# Patient Record
Sex: Male | Born: 1975 | Race: White | Hispanic: No | Marital: Married | Smoking: Never smoker
Health system: Southern US, Community
[De-identification: ages and names within clinical notes are randomized; demographics above are authoritative.]

---

## 2003-11-23 HISTORY — PX: CYST EXCISION: SHX5701

## 2013-11-07 ENCOUNTER — Encounter: Payer: Self-pay | Admitting: Family Medicine

## 2013-11-07 ENCOUNTER — Ambulatory Visit (INDEPENDENT_AMBULATORY_CARE_PROVIDER_SITE_OTHER): Payer: BC Managed Care – PPO | Admitting: Family Medicine

## 2013-11-07 VITALS — BP 110/60 | HR 72 | Temp 98.2°F | Ht 72.0 in | Wt 208.5 lb

## 2013-11-07 DIAGNOSIS — Z0000 Encounter for general adult medical examination without abnormal findings: Secondary | ICD-10-CM | POA: Insufficient documentation

## 2013-11-07 NOTE — Progress Notes (Signed)
Subjective:    Patient ID: Adam Berger, male    DOB: 03-07-1976, 37 y.o.   MRN: [id number]  HPI CC: new pt to establish  No questions or concerns today, would like CPE.  Moved here 6 yrs ago from [place].  H/o L4/5 issue 2013 after a fall down stairs - had xrays and CT scan along with ESI.  Was on gabapentin which helped but currently off.  Discussed surgery, but sxs improved with conservative management and prayer - saw [facility] ortho MD.  Has strengthened core and works out regularly.  Seat belt use discussed. Sunscreen use discussed.  No suspicious spots on skin.  No sunburns in past year.  Preventative: No recent CPE Flu shot - declines Tetanus - unsure, about 10 yrs ago. Declines Tdap today.  Lives with wife and 4 children (9,6,4,2) Occupation: pastor Edu: BS Activity: works out regularly (5:30am every morning), some cardio Diet: good water, fruits/vegetables daily, fish  Medications and allergies reviewed and updated in chart.  Past histories reviewed and updated if relevant as below. There are no active problems to display for this patient.  History reviewed. No pertinent past medical history. Past Surgical History  Procedure Laterality Date  . Cyst excision  2005    nasal cavity   History  Substance Use Topics  . Smoking status: Never Smoker   . Smokeless tobacco: Never Used  . Alcohol Use: No   Family History  Problem Relation Age of Onset  . Diabetes Maternal Grandfather   . CAD Paternal Grandfather 32    MI  . Cancer Neg Hx   . Stroke Neg Hx   . Hypertension Neg Hx    Allergies  Allergen Reactions  . Monkey-Derived Products Anaphylaxis  . Penicillins Other (See Comments)    Unknown--Childhood   No current outpatient prescriptions on file prior to visit.   No current facility-administered medications on file prior to visit.     Review of Systems  Constitutional: Negative for fever, chills, activity change, appetite change, fatigue and  unexpected weight change.  HENT: Negative for hearing loss.   Eyes: Negative for visual disturbance.  Respiratory: Negative for cough, chest tightness, shortness of breath and wheezing.   Cardiovascular: Negative for chest pain, palpitations and leg swelling.  Gastrointestinal: Negative for nausea, vomiting, abdominal pain, diarrhea, constipation, blood in stool and abdominal distention.  Genitourinary: Negative for hematuria and difficulty urinating.  Musculoskeletal: Negative for arthralgias, myalgias and neck pain.  Skin: Negative for rash.  Neurological: Negative for dizziness, seizures, syncope and headaches.  Hematological: Negative for adenopathy. Does not bruise/bleed easily.  Psychiatric/Behavioral: Negative for dysphoric mood. The patient is not nervous/anxious.        Objective:   Physical Exam  Nursing note and vitals reviewed. Constitutional: He is oriented to person, place, and time. He appears well-developed and well-nourished. No distress.  HENT:  Head: Normocephalic and atraumatic.  Right Ear: Hearing, tympanic membrane, external ear and ear canal normal.  Left Ear: Hearing, tympanic membrane, external ear and ear canal normal.  Nose: Nose normal.  Mouth/Throat: Oropharynx is clear and moist. No oropharyngeal exudate.  Eyes: Conjunctivae and EOM are normal. Pupils are equal, round, and reactive to light. No scleral icterus.  Neck: Normal range of motion. Neck supple. No thyromegaly present.  Cardiovascular: Normal rate, regular rhythm, normal heart sounds and intact distal pulses.   No murmur heard. Pulses:      Radial pulses are 2+ on the right side, and 2+ on  the left side.  Pulmonary/Chest: Effort normal and breath sounds normal. No respiratory distress. He has no wheezes. He has no rales.  Abdominal: Soft. Bowel sounds are normal. He exhibits no distension and no mass. There is no tenderness. There is no rebound and no guarding.  Musculoskeletal: Normal range of  motion. He exhibits no edema.  Lymphadenopathy:    He has no cervical adenopathy.  Neurological: He is alert and oriented to person, place, and time.  CN grossly intact, station and gait intact  Skin: Skin is warm and dry. No rash noted.  No suspicious moles.    Psychiatric: He has a normal mood and affect. His behavior is normal. Judgment and thought content normal.       Assessment & Plan:

## 2013-11-07 NOTE — Assessment & Plan Note (Signed)
Preventative protocols reviewed and updated unless pt declined. Discussed healthy diet and lifestyle.  

## 2013-11-07 NOTE — Patient Instructions (Signed)
Good to meet you today! Call us with questions. Return at your convenience fasting for routine blood work. Return as needed or in 2-3 years for next physical.

## 2013-11-07 NOTE — Progress Notes (Signed)
Pre-visit discussion using our clinic review tool. No additional management support is needed unless otherwise documented below in the visit note.  

## 2013-11-12 ENCOUNTER — Other Ambulatory Visit: Payer: BC Managed Care – PPO

## 2014-03-08 ENCOUNTER — Ambulatory Visit (INDEPENDENT_AMBULATORY_CARE_PROVIDER_SITE_OTHER): Payer: BC Managed Care – PPO | Admitting: Family Medicine

## 2014-03-08 ENCOUNTER — Encounter: Payer: Self-pay | Admitting: Family Medicine

## 2014-03-08 VITALS — BP 112/70 | HR 60 | Temp 98.2°F | Wt 208.8 lb

## 2014-03-08 DIAGNOSIS — R1900 Intra-abdominal and pelvic swelling, mass and lump, unspecified site: Secondary | ICD-10-CM

## 2014-03-08 DIAGNOSIS — R1909 Other intra-abdominal and pelvic swelling, mass and lump: Secondary | ICD-10-CM

## 2014-03-08 DIAGNOSIS — R109 Unspecified abdominal pain: Secondary | ICD-10-CM

## 2014-03-08 DIAGNOSIS — R1030 Lower abdominal pain, unspecified: Secondary | ICD-10-CM

## 2014-03-08 NOTE — Progress Notes (Signed)
Pre visit review using our clinic review tool, if applicable. No additional management support is needed unless otherwise documented below in the visit note.  H/o back pain, episodically.  He had fallen a year ago, injured his elbow. Then had a L4L5 bulge, s/p injection x2.  [facility], started on gabapentin for sciatica pain. He was able to get back to exercising and get back to his baseline weight. Sciatic pain resolved, since last April.   Has been working out consistently.  A lot of upper body work.  While working out had R testicle pain.  Burning pain. He stopped working out, laid off for 1 week. Returned to working out 1 week later and then it returned. Better with stopping exercise, but had some residual pain after the second episode.  No abd pain but some occ medial R thigh pain.  No dysuria. No blood in urine.  No testicle pain now.  Usually happened with triceps extensions.    Meds, vitals, and allergies reviewed.   ROS: See HPI.  Otherwise, noncontributory.  nad ncat Mmm rrr ctab abd soft, not ttp likely R IH Testes bilaterally descended without nodularity, tenderness or masses. No scrotal masses or lesions. No penis lesions or urethral discharge.

## 2014-03-08 NOTE — Patient Instructions (Signed)
Adam Berger will call about your referral. Limit lifting as much as possible in the meantime.  Take ibuprofen in the meantime after eating if needed.

## 2014-03-10 DIAGNOSIS — R1909 Other intra-abdominal and pelvic swelling, mass and lump: Secondary | ICD-10-CM | POA: Insufficient documentation

## 2014-03-10 NOTE — Assessment & Plan Note (Signed)
Suggestive sx/hx and I told him I think I feel the mass on the R side.  Refer to gen surgery and limit lifting in meantime. D/w pt re: anatomy.  He agrees.

## 2014-03-11 ENCOUNTER — Telehealth: Payer: Self-pay | Admitting: *Deleted

## 2014-03-11 ENCOUNTER — Telehealth: Payer: Self-pay | Admitting: Family Medicine

## 2014-03-11 NOTE — Telephone Encounter (Signed)
Patient Information:  Caller Name: Sharia ReeveJosh  Phone: [phone]  Patient: Adam Berger, [name]  Gender: Male  DOB: 26-Jan-1976  Age: [age] Years  PCP: Crawford Givensuncan, Graham Clelia Croft(Shaw) [facility](Family Practice)  Office Follow Up:  Does the office need to follow up with this patient?: No  Instructions For The Office: N/A  RN Note:  Pt reports that [facility]  Office contacted per Profile for ED dispo and advised to go ahead and send pt to ED.  Pt agrees.  Symptoms  Reason For Call & Symptoms: Pt was seen and examined in office on 4/17 right testicle pain.  Pt reports that since exam the pain has been much worse.  Pt also reports that he has burning with unination.  Pt states that he was planned referral to gen sug but now is unsure if he can wait.  Pain is reported as moderate and he is currently unable to function. Nausea is present.  Last BM on 4/19.  Pt reports that he needs medical attention today and is unable to wait until Gen Sug can be scheduled.  Reviewed Health History In EMR: Yes  Reviewed Medications In EMR: Yes  Reviewed Allergies In EMR: Yes  Reviewed Surgeries / Procedures: Yes  Date of Onset of Symptoms: 03/08/2014  Guideline(s) Used:  Abdominal Pain - Male  Disposition Per Guideline:   Go to ED Now  Reason For Disposition Reached:   Severe abdominal pain (e.g., excruciating)  Advice Given:  N/A  Patient Will Follow Care Advice:  YES

## 2014-03-11 NOTE — Telephone Encounter (Signed)
Amy at [facility] says the patient says he has been in excruciating pain since the exam on Friday.  Amy ([facility]) feels that the patient should go to ER and is asking if you would agree.  I took the liberty to say that I felt you would agree.  [facility].  I asked Amy to please have him to request his records of the visit to be sent to our office.

## 2014-03-11 NOTE — Telephone Encounter (Signed)
Agree with ER.

## 2014-03-12 ENCOUNTER — Telehealth: Payer: Self-pay

## 2014-03-12 DIAGNOSIS — K4090 Unilateral inguinal hernia, without obstruction or gangrene, not specified as recurrent: Secondary | ICD-10-CM

## 2014-03-12 NOTE — Telephone Encounter (Signed)
Patient advised.

## 2014-03-12 NOTE — Telephone Encounter (Signed)
Ordered. Thanks

## 2014-03-12 NOTE — Telephone Encounter (Signed)
Mrs Neysa BonitoGresham said pt was seen [facility] 03/11/14; pt was dx with hernia but [facility] mid - end of May. Pt pain level in groin fluctuates between 2 - 8. Pt cannot wait til mid May for surgery. Mrs Neysa BonitoGresham request surgical referral to doctor in [place] or [place] ASAP. Mrs Neysa BonitoGresham request cb.

## 2014-03-25 ENCOUNTER — Ambulatory Visit (INDEPENDENT_AMBULATORY_CARE_PROVIDER_SITE_OTHER): Payer: BC Managed Care – PPO | Admitting: Surgery

## 2014-03-28 ENCOUNTER — Encounter: Payer: Self-pay | Admitting: Family Medicine

## 2014-04-22 HISTORY — PX: VASECTOMY: SHX75

## 2014-04-22 HISTORY — PX: INGUINAL HERNIA REPAIR: SUR1180

## 2014-04-25 ENCOUNTER — Encounter: Payer: Self-pay | Admitting: Family Medicine

## 2014-05-04 ENCOUNTER — Encounter: Payer: Self-pay | Admitting: Family Medicine

## 2016-11-22 HISTORY — PX: CARDIOVASCULAR STRESS TEST: SHX262

## 2016-11-25 ENCOUNTER — Ambulatory Visit (INDEPENDENT_AMBULATORY_CARE_PROVIDER_SITE_OTHER): Payer: BLUE CROSS/BLUE SHIELD | Admitting: Family Medicine

## 2016-11-25 ENCOUNTER — Encounter: Payer: Self-pay | Admitting: Family Medicine

## 2016-11-25 VITALS — BP 124/80 | HR 76 | Temp 98.9°F | Ht 72.0 in | Wt 208.8 lb

## 2016-11-25 DIAGNOSIS — B351 Tinea unguium: Secondary | ICD-10-CM

## 2016-11-25 DIAGNOSIS — Z131 Encounter for screening for diabetes mellitus: Secondary | ICD-10-CM

## 2016-11-25 DIAGNOSIS — Z13220 Encounter for screening for lipoid disorders: Secondary | ICD-10-CM | POA: Diagnosis not present

## 2016-11-25 DIAGNOSIS — Z23 Encounter for immunization: Secondary | ICD-10-CM

## 2016-11-25 DIAGNOSIS — Z0000 Encounter for general adult medical examination without abnormal findings: Secondary | ICD-10-CM

## 2016-11-25 MED ORDER — TERBINAFINE HCL 250 MG PO TABS: 250.0000 mg | ORAL_TABLET | Freq: Every day | ORAL | 0 refills | Status: DC

## 2016-11-25 NOTE — Addendum Note (Signed)
Addended by: Josph MachoANCE, KIMBERLY A on: 11/25/2016 12:29 PM   Modules accepted: Orders

## 2016-11-25 NOTE — Patient Instructions (Addendum)
Tdap today. Return at your convenience fasting for blood work.  Take terbinafine 250mg  daily for 6 weeks. Let me know if ongoing trouble after this.  Lotrimin between toes. Return as needed or in 2-3 years for next physical.   Health Maintenance, Male A healthy lifestyle and preventative care can promote health and wellness.  Maintain regular health, dental, and eye exams.  Eat a healthy diet. Foods like vegetables, fruits, whole grains, low-fat dairy products, and lean protein foods contain the nutrients you need and are low in calories. Decrease your intake of foods high in solid fats, added sugars, and salt. Get information about a proper diet from your health care provider, if necessary.  Regular physical exercise is one of the most important things you can do for your health. Most adults should get at least 150 minutes of moderate-intensity exercise (any activity that increases your heart rate and causes you to sweat) each week. In addition, most adults need muscle-strengthening exercises on 2 or more days a week.   Maintain a healthy weight. The body mass index (BMI) is a screening tool to identify possible weight problems. It provides an estimate of body fat based on height and weight. Your health care provider can find your BMI and can help you achieve or maintain a healthy weight. For males 20 years and older:  A BMI below 18.5 is considered underweight.  A BMI of 18.5 to 24.9 is normal.  A BMI of 25 to 29.9 is considered overweight.  A BMI of 30 and above is considered obese.  Maintain normal blood lipids and cholesterol by exercising and minimizing your intake of saturated fat. Eat a balanced diet with plenty of fruits and vegetables. Blood tests for lipids and cholesterol should begin at age 39 and be repeated every 5 years. If your lipid or cholesterol levels are high, you are over age 39, or you are at high risk for heart disease, you may need your cholesterol levels checked  more frequently.Ongoing high lipid and cholesterol levels should be treated with medicines if diet and exercise are not working.  If you smoke, find out from your health care provider how to quit. If you do not use tobacco, do not start.  Lung cancer screening is recommended for adults aged 55-80 years who are at high risk for developing lung cancer because of a history of smoking. A yearly low-dose CT scan of the lungs is recommended for people who have at least a 30-pack-year history of smoking and are current smokers or have quit within the past 15 years. A pack year of smoking is smoking an average of 1 pack of cigarettes a day for 1 year (for example, a 30-pack-year history of smoking could mean smoking 1 pack a day for 30 years or 2 packs a day for 15 years). Yearly screening should continue until the smoker has stopped smoking for at least 15 years. Yearly screening should be stopped for people who develop a health problem that would prevent them from having lung cancer treatment.  If you choose to drink alcohol, do not have more than 2 drinks per day. One drink is considered to be 12 oz (360 mL) of beer, 5 oz (150 mL) of wine, or 1.5 oz (45 mL) of liquor.  Avoid the use of street drugs. Do not share needles with anyone. Ask for help if you need support or instructions about stopping the use of drugs.  High blood pressure causes heart disease and increases the  risk of stroke. High blood pressure is more likely to develop in:  People who have blood pressure in the end of the normal range (100-139/85-89 mm Hg).  People who are overweight or obese.  People who are African American.  If you are 80-64 years of age, have your blood pressure checked every 3-5 years. If you are 30 years of age or older, have your blood pressure checked every year. You should have your blood pressure measured twice-once when you are at a hospital or clinic, and once when you are not at a hospital or clinic. Record  the average of the two measurements. To check your blood pressure when you are not at a hospital or clinic, you can use:  An automated blood pressure machine at a pharmacy.  A home blood pressure monitor.  If you are 58-25 years old, ask your health care provider if you should take aspirin to prevent heart disease.  Diabetes screening involves taking a blood sample to check your fasting blood sugar level. This should be done once every 3 years after age 42 if you are at a normal weight and without risk factors for diabetes. Testing should be considered at a younger age or be carried out more frequently if you are overweight and have at least 1 risk factor for diabetes.  Colorectal cancer can be detected and often prevented. Most routine colorectal cancer screening begins at the age of 23 and continues through age 44. However, your health care provider may recommend screening at an earlier age if you have risk factors for colon cancer. On a yearly basis, your health care provider may provide home test kits to check for hidden blood in the stool. A small camera at the end of a tube may be used to directly examine the colon (sigmoidoscopy or colonoscopy) to detect the earliest forms of colorectal cancer. Talk to your health care provider about this at age 68 when routine screening begins. A direct exam of the colon should be repeated every 5-10 years through age 37, unless early forms of precancerous polyps or small growths are found.  People who are at an increased risk for hepatitis B should be screened for this virus. You are considered at high risk for hepatitis B if:  You were born in a country where hepatitis B occurs often. Talk with your health care provider about which countries are considered high risk.  Your parents were born in a high-risk country and you have not received a shot to protect against hepatitis B (hepatitis B vaccine).  You have HIV or AIDS.  You use needles to inject  street drugs.  You live with, or have sex with, someone who has hepatitis B.  You are a man who has sex with other men (MSM).  You get hemodialysis treatment.  You take certain medicines for conditions like cancer, organ transplantation, and autoimmune conditions.  Hepatitis C blood testing is recommended for all people born from [birth year] through 1963 and any individual with known risk factors for hepatitis C.  Healthy men should no longer receive prostate-specific antigen (PSA) blood tests as part of routine cancer screening. Talk to your health care provider about prostate cancer screening.  Testicular cancer screening is not recommended for adolescents or adult males who have no symptoms. Screening includes self-exam, a health care provider exam, and other screening tests. Consult with your health care provider about any symptoms you have or any concerns you have about testicular cancer.  Practice safe  sex. Use condoms and avoid high-risk sexual practices to reduce the spread of sexually transmitted infections (STIs).  You should be screened for STIs, including gonorrhea and chlamydia if:  You are sexually active and are younger than 24 years.  You are older than 24 years, and your health care provider tells you that you are at risk for this type of infection.  Your sexual activity has changed since you were last screened, and you are at an increased risk for chlamydia or gonorrhea. Ask your health care provider if you are at risk.  If you are at risk of being infected with HIV, it is recommended that you take a prescription medicine daily to prevent HIV infection. This is called pre-exposure prophylaxis (PrEP). You are considered at risk if:  You are a man who has sex with other men (MSM).  You are a heterosexual man who is sexually active with multiple partners.  You take drugs by injection.  You are sexually active with a partner who has HIV.  Talk with your health care provider  about whether you are at high risk of being infected with HIV. If you choose to begin PrEP, you should first be tested for HIV. You should then be tested every 3 months for as long as you are taking PrEP.  Use sunscreen. Apply sunscreen liberally and repeatedly throughout the day. You should seek shade when your shadow is shorter than you. Protect yourself by wearing long sleeves, pants, a wide-brimmed hat, and sunglasses year round whenever you are outdoors.  Tell your health care provider of new moles or changes in moles, especially if there is a change in shape or color. Also, tell your health care provider if a mole is larger than the size of a pencil eraser.  A one-time screening for abdominal aortic aneurysm (AAA) and surgical repair of large AAAs by ultrasound is recommended for men aged 65-75 years who are current or former smokers.  Stay current with your vaccines (immunizations). This information is not intended to replace advice given to you by your health care provider. Make sure you discuss any questions you have with your health care provider. Document Released: 05/06/2008 Document Revised: 11/29/2014 Document Reviewed: 08/12/2015 Elsevier Interactive Patient Education  2017 [employer].

## 2016-11-25 NOTE — Assessment & Plan Note (Signed)
Preventative protocols reviewed and updated unless pt declined. Discussed healthy diet and lifestyle.  

## 2016-11-25 NOTE — Progress Notes (Signed)
BP 124/80   Pulse 76   Temp 98.9 F (37.2 C) (Oral)   Ht 6' (1.829 m)   Wt 208 lb 12 oz (94.7 kg)   BMI 28.31 kg/m    CC: CPE Subjective:    Patient ID: Adam Berger, male    DOB: [birth year], 41 y.o.   MRN: [id number]  HPI: Adam Berger is a 41 y.o. male presenting on 11/25/2016 for Annual Exam   Onychomycosis of great toes - worse after hiking [place] (yearly occurrence).   Preventative: Flu shot - declines Tdap today Seat belt use discussed Sunscreen use discussed. 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 Interim medical history since our last visit reviewed. Allergies and medications reviewed and updated. No current outpatient prescriptions on file prior to visit.   No current facility-administered medications on file prior to visit.     Review of Systems  Constitutional: Negative for activity change, appetite change, chills, fatigue, fever and unexpected weight change.  HENT: Negative for hearing loss.   Eyes: Negative for visual disturbance.  Respiratory: Negative for cough, chest tightness, shortness of breath and wheezing.   Cardiovascular: Negative for chest pain, palpitations and leg swelling.  Gastrointestinal: Negative for abdominal distention, abdominal pain, blood in stool, constipation, diarrhea, nausea and vomiting.  Genitourinary: Negative for difficulty urinating and hematuria.  Musculoskeletal: Negative for arthralgias, myalgias and neck pain.  Skin: Negative for rash.  Neurological: Negative for dizziness, seizures, syncope and headaches.  Hematological: Negative for adenopathy. Does not bruise/bleed easily.  Psychiatric/Behavioral: Negative for dysphoric mood.  The patient is not nervous/anxious.    Per HPI unless specifically indicated in ROS section     Objective:    BP 124/80   Pulse 76   Temp 98.9 F (37.2 C) (Oral)   Ht 6' (1.829 m)   Wt 208 lb 12 oz (94.7 kg)   BMI 28.31 kg/m   Wt Readings from Last 3 Encounters:  11/25/16 208 lb 12 oz (94.7 kg)  03/08/14 208 lb 12 oz (94.7 kg)  11/07/13 208 lb 8 oz (94.6 kg)    Physical Exam  Constitutional: He is oriented to person, place, and time. He appears well-developed and well-nourished. No distress.  HENT:  Head: Normocephalic and atraumatic.  Right Ear: Hearing, tympanic membrane, external ear and ear canal normal.  Left Ear: Hearing, tympanic membrane, external ear and ear canal normal.  Nose: Nose normal.  Mouth/Throat: Uvula is midline, oropharynx is clear and moist and mucous membranes are normal. No oropharyngeal exudate, posterior oropharyngeal edema or posterior oropharyngeal erythema.  Eyes: Conjunctivae and EOM are normal. Pupils are equal, round, and reactive to light. No scleral icterus.  Neck: Normal range of motion. Neck supple. No thyromegaly present.  Cardiovascular: Normal rate, regular rhythm, normal heart sounds and intact distal pulses.   No murmur heard. Pulses:      Radial pulses are 2+ on the right side, and 2+ on the left side.  Pulmonary/Chest: Effort normal and breath sounds normal. No respiratory distress. He has no wheezes. He has no rales.  Abdominal: Soft. Bowel sounds are normal. He exhibits no distension and no mass. There is no tenderness. There is no rebound and no guarding.  Musculoskeletal: Normal range of motion. He  exhibits no edema.  Lymphadenopathy:    He has no cervical adenopathy.  Neurological: He is alert and oriented to person, place, and time.  CN grossly intact, station and gait intact  Skin: Skin is warm and dry. No rash noted.  Onychomycosis of both great toes Skin maceration between 4th/5th toes bilaterally  Psychiatric: He has a  normal mood and affect. His behavior is normal. Judgment and thought content normal.  Nursing note and vitals reviewed.  No results found for this or any previous visit.    Assessment & Plan:   Problem List Items Addressed This Visit    Healthcare maintenance - Primary    Preventative protocols reviewed and updated unless pt declined. Discussed healthy diet and lifestyle.       Onychomycosis    Treat with terbinafine 6 wk course. Update with effect.       Relevant Medications   terbinafine (LAMISIL) 250 MG tablet   Other Relevant Orders   Comprehensive metabolic panel    Other Visit Diagnoses    Lipid screening       Relevant Orders   Lipid panel   Diabetes mellitus screening       Relevant Orders   Comprehensive metabolic panel       Follow up plan: Return in about 2 years (around 11/25/2018) for annual exam, prior fasting for blood work.  Eustaquio Boyden, MD

## 2016-11-25 NOTE — Assessment & Plan Note (Signed)
Treat with terbinafine 6 wk course. Update with effect.

## 2016-11-25 NOTE — Progress Notes (Signed)
Pre visit review using our clinic review tool, if applicable. No additional management support is needed unless otherwise documented below in the visit note. 

## 2016-11-26 ENCOUNTER — Other Ambulatory Visit (INDEPENDENT_AMBULATORY_CARE_PROVIDER_SITE_OTHER): Payer: BLUE CROSS/BLUE SHIELD

## 2016-11-26 DIAGNOSIS — Z13220 Encounter for screening for lipoid disorders: Secondary | ICD-10-CM

## 2016-11-26 DIAGNOSIS — Z131 Encounter for screening for diabetes mellitus: Secondary | ICD-10-CM

## 2016-11-26 DIAGNOSIS — B351 Tinea unguium: Secondary | ICD-10-CM

## 2016-11-26 LAB — COMPREHENSIVE METABOLIC PANEL
ALBUMIN: 4.5 g/dL (ref 3.5–5.2)
ALT: 16 U/L (ref 0–53)
AST: 17 U/L (ref 0–37)
Alkaline Phosphatase: 53 U/L (ref 39–117)
BUN: 23 mg/dL (ref 6–23)
CHLORIDE: 106 meq/L (ref 96–112)
CO2: 31 mEq/L (ref 19–32)
Calcium: 9.4 mg/dL (ref 8.4–10.5)
Creatinine, Ser: 1.29 mg/dL (ref 0.40–1.50)
GFR: 66.03 mL/min (ref >60.00)
Glucose, Bld: 97 mg/dL (ref 70–99)
POTASSIUM: 4.8 meq/L (ref 3.5–5.1)
SODIUM: 140 meq/L (ref 135–145)
Total Bilirubin: 0.9 mg/dL (ref 0.2–1.2)
Total Protein: 7.3 g/dL (ref 6.0–8.3)

## 2016-11-26 LAB — LIPID PANEL
CHOLESTEROL: 206 mg/dL — AB (ref 0–200)
HDL: 43.8 mg/dL (ref >39.00)
LDL CALC: 149 mg/dL — AB (ref 0–99)
NonHDL: 162.17
TRIGLYCERIDES: 68 mg/dL (ref 0.0–149.0)
Total CHOL/HDL Ratio: 5
VLDL: 13.6 mg/dL (ref 0.0–40.0)

## 2017-01-11 ENCOUNTER — Telehealth: Payer: Self-pay

## 2017-01-11 DIAGNOSIS — Z5181 Encounter for therapeutic drug level monitoring: Secondary | ICD-10-CM

## 2017-01-11 NOTE — Telephone Encounter (Signed)
Pt was seen 11/25/16 and pt has been taking terbinafine 250 mg and will finish rx on 01/12/17. Pt said toenail not grown out yet but now still looks like fungus is there. Pt wants to know next step. [facility] [address]. Pt request cb.

## 2017-01-11 NOTE — Telephone Encounter (Signed)
Come in for lab visit only - repeat liver function. If normal, we can do another 6 wk course terbinafine.

## 2017-01-12 NOTE — Telephone Encounter (Signed)
Message left on voice mail for patient to call back to schedule lab appointment.

## 2017-01-13 ENCOUNTER — Other Ambulatory Visit (INDEPENDENT_AMBULATORY_CARE_PROVIDER_SITE_OTHER): Payer: BLUE CROSS/BLUE SHIELD

## 2017-01-13 ENCOUNTER — Other Ambulatory Visit: Payer: Self-pay | Admitting: Family Medicine

## 2017-01-13 DIAGNOSIS — Z5181 Encounter for therapeutic drug level monitoring: Secondary | ICD-10-CM

## 2017-01-13 LAB — HEPATIC FUNCTION PANEL
ALK PHOS: 53 U/L (ref 39–117)
ALT: 25 U/L (ref 0–53)
AST: 21 U/L (ref 0–37)
Albumin: 4.4 g/dL (ref 3.5–5.2)
BILIRUBIN TOTAL: 0.4 mg/dL (ref 0.2–1.2)
Bilirubin, Direct: 0.1 mg/dL (ref 0.0–0.3)
Total Protein: 7.1 g/dL (ref 6.0–8.3)

## 2017-01-13 MED ORDER — TERBINAFINE HCL 250 MG PO TABS: 250.0000 mg | ORAL_TABLET | Freq: Every day | ORAL | 0 refills | Status: DC

## 2017-08-28 ENCOUNTER — Ambulatory Visit
Admission: EM | Admit: 2017-08-28 | Discharge: 2017-08-28 | Disposition: A | Discharge status: Home / Self Care | Payer: BLUE CROSS/BLUE SHIELD | Attending: Family Medicine | Admitting: Family Medicine

## 2017-08-28 ENCOUNTER — Ambulatory Visit (INDEPENDENT_AMBULATORY_CARE_PROVIDER_SITE_OTHER): Payer: BLUE CROSS/BLUE SHIELD

## 2017-08-28 ENCOUNTER — Other Ambulatory Visit: Payer: Self-pay

## 2017-08-28 DIAGNOSIS — R0789 Other chest pain: Secondary | ICD-10-CM | POA: Insufficient documentation

## 2017-08-28 DIAGNOSIS — R0602 Shortness of breath: Secondary | ICD-10-CM | POA: Diagnosis not present

## 2017-08-28 DIAGNOSIS — Z8249 Family history of ischemic heart disease and other diseases of the circulatory system: Secondary | ICD-10-CM | POA: Diagnosis not present

## 2017-08-28 DIAGNOSIS — Z880 Allergy status to penicillin: Secondary | ICD-10-CM | POA: Diagnosis not present

## 2017-08-28 DIAGNOSIS — R079 Chest pain, unspecified: Secondary | ICD-10-CM

## 2017-08-28 NOTE — ED Triage Notes (Signed)
Pt reports intermittent left sided chest pain x past few weeks. Has felt like he can't get a good breath on the left lung. Had pain from about 8:30 until about 11:45. Ibuprofen helps the pain. No pain in triage. Still feels like he isn't getting a full breath in triage. NAD, Not appearing SOB.

## 2017-08-28 NOTE — ED Provider Notes (Signed)
[facility]    CSN: [id number] Arrival date & time: 08/28/17  1454     History   Chief Complaint Chief Complaint  Patient presents with  . Chest Pain    HPI Tresten Pantoja is a 41 y.o. male.   41 yo male presents with a c/o intermittent left sided chest pains and shortness of breath for the past 2 months. States had an episode earlier today with pain at it's worst (about a 5-6/10). He took some ibuprofen and the pain has subsided and almost completely resolved. States when he takes a deep breath, it feels like he's not filling his lungs completely. His prior episodes have occurred intermittently mainly at rest. States he exercises regularly jogging several miles a couple of times a week and denies having chest pain while exercising. Denies anxiety, fevers, chills, cough, recent prolong immobilization, calf pain, recent surgery, smoking, medications. States he's generally healthy. No cardiac risk factors.    The history is provided by the patient.  Chest Pain    History reviewed. No pertinent past medical history.  Patient Active Problem List   Diagnosis Date Noted  . Onychomycosis 11/25/2016  . Healthcare maintenance 11/07/2013    Past Surgical History:  Procedure Laterality Date  . CYST EXCISION  2005   nasal cavity  . INGUINAL HERNIA REPAIR Bilateral 04/2014   with mesh [facility])  . VASECTOMY  04/2014   [facility])       Home Medications    Prior to Admission medications   Medication Sig Start Date End Date Taking? Authorizing Provider  terbinafine (LAMISIL) 250 MG tablet Take 1 tablet (250 mg total) by mouth daily. 01/13/17   Eustaquio Boyden, MD    Family History Family History  Problem Relation Age of Onset  . Diabetes Maternal Grandfather   . CAD Paternal Grandfather 62       MI (smoker)  . Cancer Father        skin cancer on nose  . Stroke Neg Hx   . Hypertension Neg Hx     Social History Social History  Substance Use Topics  . Smoking  status: Never Smoker  . Smokeless tobacco: Never Used  . Alcohol use No     Allergies   Monkey-derived products and Penicillins   Review of Systems Review of Systems  Cardiovascular: Positive for chest pain.     Physical Exam Triage Vital Signs ED Triage Vitals  Enc Vitals Group     BP 08/28/17 1506 125/66     Pulse Rate 08/28/17 1506 66     Resp 08/28/17 1506 18     Temp 08/28/17 1506 (!) 97.5 F (36.4 C)     Temp Source 08/28/17 1506 Oral     SpO2 08/28/17 1506 96 %     Weight 08/28/17 1504 205 lb (93 kg)     Height 08/28/17 1504 6' (1.829 m)     Head Circumference --      Peak Flow --      Pain Score 08/28/17 1510 0     Pain Loc --      Pain Edu? --      Excl. in GC? --    No data found.   Updated Vital Signs BP 125/66 (BP Location: Right Arm)   Pulse 66   Temp (!) 97.5 F (36.4 C) (Oral)   Resp 18   Ht 6' (1.829 m)   Wt 205 lb (93 kg)   SpO2 96%   BMI  27.80 kg/m   Visual Acuity Right Eye Distance:   Left Eye Distance:   Bilateral Distance:    Right Eye Near:   Left Eye Near:    Bilateral Near:     Physical Exam  Constitutional: He appears well-developed and well-nourished. No distress.  HENT:  Head: Normocephalic and atraumatic.  Nose: Nose normal.  Neck: Normal range of motion. Neck supple. No tracheal deviation present. No thyromegaly present.  Cardiovascular: Normal rate, regular rhythm, normal heart sounds and intact distal pulses.   No murmur heard. Pulmonary/Chest: Effort normal and breath sounds normal. No stridor. No respiratory distress. He has no wheezes. He has no rales. He exhibits no tenderness.  Abdominal: Soft. He exhibits no distension. There is no tenderness.  Musculoskeletal: He exhibits no edema or tenderness.  Lymphadenopathy:    He has no cervical adenopathy.  Neurological: He is alert.  Skin: Skin is warm and dry. No rash noted. He is not diaphoretic.  Nursing note and vitals reviewed.    UC Treatments / Results    Labs (all labs ordered are listed, but only abnormal results are displayed) Labs Reviewed - No data to display  EKG  EKG Interpretation None       Radiology Dg Chest 2 View  Result Date: 08/28/2017 CLINICAL DATA:  PT with a heavy feeling on his left chest x today. Pt denies hx of injury, trauma, or surgery. EXAM: CHEST  2 VIEW COMPARISON:  None. FINDINGS: The heart size and mediastinal contours are within normal limits. Both lungs are clear. No pleural effusion or pneumothorax. The visualized skeletal structures are unremarkable. IMPRESSION: Normal chest radiographs. Electronically Signed   By: Amie Portland M.D.   On: 08/28/2017 15:23    Procedures ED EKG Date/Time: 08/28/2017 4:20 PM Performed by: Payton Mccallum Authorized by: Payton Mccallum   ECG reviewed by ED Physician in the absence of a cardiologist: yes   Previous ECG:    Previous ECG:  Unavailable Interpretation:    Interpretation: normal   Rate:    ECG rate:  75   ECG rate assessment: normal   Rhythm:    Rhythm: sinus rhythm   Ectopy:    Ectopy: none   QRS:    QRS axis:  Normal Conduction:    Conduction: normal   ST segments:    ST segments:  Normal T waves:    T waves: normal      (including critical care time)  Medications Ordered in UC Medications - No data to display   Initial Impression / Assessment and Plan / UC Course  I have reviewed the triage vital signs and the nursing notes.  Pertinent labs & imaging results that were available during my care of the patient were reviewed by me and considered in my medical decision making (see chart for details).      Final Clinical Impressions(s) / UC Diagnoses   Final diagnoses:  Nonspecific chest pain  (chronic, intermittent)   New Prescriptions Discharge Medication List as of 08/28/2017  4:11 PM     1. ekg/x-ray results (normal/negative) and possible/differential diagnoses reviewed with patient and wife 2. Discussed further work up here  with D-dimer test (explained test), however patient refuses blood test; explained also further evaluation with cardiac enzymes in the Emergency Department; patient states will follow up with his PCP tomorrow 3.  Follow up with PCP tomorrow for possible referral for further testing/specialist evaluation  4. Preventative ASA  po qd 5. Go to ED if symptoms  worsen/recur  Controlled Substance Prescriptions [place] Controlled Substance Registry consulted? Not Applicable   Payton Mccallum, MD 08/28/17 1640

## 2017-08-30 ENCOUNTER — Encounter: Payer: Self-pay | Admitting: Family Medicine

## 2017-08-30 ENCOUNTER — Ambulatory Visit (INDEPENDENT_AMBULATORY_CARE_PROVIDER_SITE_OTHER): Payer: BLUE CROSS/BLUE SHIELD | Admitting: Family Medicine

## 2017-08-30 VITALS — BP 118/68 | HR 63 | Temp 98.6°F | Wt 205.5 lb

## 2017-08-30 DIAGNOSIS — R079 Chest pain, unspecified: Secondary | ICD-10-CM

## 2017-08-30 MED ORDER — ASPIRIN EC 81 MG PO TBEC: 81.0000 mg | DELAYED_RELEASE_TABLET | Freq: Every day | ORAL | Status: DC

## 2017-08-30 NOTE — Assessment & Plan Note (Addendum)
Non exertional substernal chest pressure relieved with rest and ibuprofen. Overall atypical chest pain but given fmhx will need to r/o cardiac cause.  Check labs today (TSH, TnI, BMP, dLDL).  rec start aspirin  daily, avoid aerobic exercise for now, will refer to cardiology to discuss possible exercise treadmill stress test. Pt agrees with plan.

## 2017-08-30 NOTE — Progress Notes (Signed)
BP 118/68 (BP Location: Left Arm, Patient Position: Sitting, Cuff Size: Normal)   Pulse 63   Temp 98.6 F (37 C) (Oral)   Wt 205 lb 8 oz (93.2 kg)   SpO2 96%   BMI 27.87 kg/m    CC: f/u from [facility]  Subjective:    Patient ID: Adam Berger, male    DOB: 20-Jul-1976, 41 y.o.   MRN: [id number]  HPI: Adam Berger is a 41 y.o. male presenting on 08/30/2017 for Emergency Dept follow-up (Visited urgent care in [facility] 08/28/17 due to chest pain)   Seen Sunday at [facility] with 2 mo h/o intermittent left sided chest pain and difficulty taking deep breaths. Improved with ibuprofen . EKG and Xray were reassuring (reviewed).   Describes 2 month h/o intermittent substernal and left sided chest pressure. Improves with time, not reproducible. This does not feel like indigestion. Denies heartburn symptoms. No unilateral leg swelling. No coughing, no fever, dizziness, palpitations. Left jaw pain over the past month - saw dentist with reassuring dental evaluation.   Had similar pressure pain at rest while on vacation in [place] 2 wks ago, improved over time.  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  Normally runs for exercise - no pain with exertion, has not run in last few weeks.   Non smoker, no alcohol.   Fmhx CAD - paternal grandfather age 64 from massive MI (overweight, smoker).  Currently no further chest pain.  Relevant past medical, surgical, family and social history reviewed and updated as indicated. Interim medical history since our last visit reviewed. Allergies and medications reviewed and updated. Outpatient Medications Prior to Visit  Medication Sig Dispense Refill  . terbinafine (LAMISIL) 250 MG tablet Take 1 tablet (250 mg total) by mouth daily. 45 tablet 0   No facility-administered medications prior to visit.      Per HPI unless specifically indicated in ROS section  below Review of Systems     Objective:    BP 118/68 (BP Location: Left Arm, Patient Position: Sitting, Cuff Size: Normal)   Pulse 63   Temp 98.6 F (37 C) (Oral)   Wt 205 lb 8 oz (93.2 kg)   SpO2 96%   BMI 27.87 kg/m   Wt Readings from Last 3 Encounters:  08/30/17 205 lb 8 oz (93.2 kg)  08/28/17 205 lb (93 kg)  11/25/16 208 lb 12 oz (94.7 kg)    Physical Exam  Constitutional: He appears well-developed and well-nourished. No distress.  HENT:  Mouth/Throat: Oropharynx is clear and moist. No oropharyngeal exudate.  Eyes: Pupils are equal, round, and reactive to light. Conjunctivae are normal.  Neck: Normal range of motion. Neck supple. No thyromegaly present.  Cardiovascular: Normal rate, regular rhythm, normal heart sounds and intact distal pulses.   No murmur heard. Pulmonary/Chest: Effort normal and breath sounds normal. No respiratory distress. He has no wheezes. He has no rales. He exhibits tenderness (Some discomfort at xyphoid but not reproducible of prior symptoms).  Lungs clear  Musculoskeletal: He exhibits no edema.  Lymphadenopathy:    He has no cervical adenopathy.  Psychiatric: He has a normal mood and affect.  Nursing note and vitals reviewed.   DG Chest 2 View CLINICAL DATA:  PT with a heavy feeling on his left chest x today. Pt denies hx of injury, trauma, or surgery.  EXAM: CHEST  2 VIEW  COMPARISON:  None.  FINDINGS:  The heart size and mediastinal contours are within normal limits. Both lungs are clear. No pleural effusion or pneumothorax. The visualized skeletal structures are unremarkable.  IMPRESSION: Normal chest radiographs.  Electronically Signed   By: Amie Portland M.D.   On: 08/28/2017 15:23      Assessment & Plan:   Problem List Items Addressed This Visit    Chest pain - Primary    Non exertional substernal chest pressure relieved with rest and ibuprofen. Overall atypical chest pain but given fmhx will need to r/o cardiac cause.   Check labs today (TSH, TnI, BMP, dLDL).  rec start aspirin  daily, avoid aerobic exercise for now, will refer to cardiology to discuss possible exercise treadmill stress test. Pt agrees with plan.       Relevant Orders   TSH   Basic metabolic panel   LDL Cholesterol, Direct   Ambulatory referral to Cardiology   Troponin I       Follow up plan: No Follow-up on file.  Eustaquio Boyden, MD

## 2017-08-30 NOTE — Patient Instructions (Addendum)
Start aspirin  daily Labs today  I do want to rule out heart as cause of symptoms.  We will refer you to cardiologist - for further evaluation.

## 2017-08-31 LAB — BASIC METABOLIC PANEL
BUN: 22 mg/dL (ref 6–23)
CALCIUM: 9.8 mg/dL (ref 8.4–10.5)
CO2: 30 meq/L (ref 19–32)
CREATININE: 1.31 mg/dL (ref 0.40–1.50)
Chloride: 103 mEq/L (ref 96–112)
GFR: 64.61 mL/min (ref >60.00)
Glucose, Bld: 83 mg/dL (ref 70–99)
Potassium: 4.7 mEq/L (ref 3.5–5.1)
SODIUM: 139 meq/L (ref 135–145)

## 2017-08-31 LAB — LDL CHOLESTEROL, DIRECT: Direct LDL: 129 mg/dL

## 2017-08-31 LAB — TROPONIN I: Troponin I: <0.01 ng/mL (ref <=0.0)

## 2017-08-31 LAB — TSH: TSH: 1.79 u[IU]/mL (ref 0.35–4.50)

## 2017-09-01 ENCOUNTER — Ambulatory Visit (INDEPENDENT_AMBULATORY_CARE_PROVIDER_SITE_OTHER): Payer: BLUE CROSS/BLUE SHIELD | Admitting: Cardiovascular Disease

## 2017-09-01 ENCOUNTER — Encounter: Payer: Self-pay | Admitting: Cardiovascular Disease

## 2017-09-01 VITALS — BP 110/76 | HR 61 | Ht 72.0 in | Wt 206.8 lb

## 2017-09-01 DIAGNOSIS — R079 Chest pain, unspecified: Secondary | ICD-10-CM

## 2017-09-01 NOTE — Patient Instructions (Addendum)
Medication Instructions:  Your physician recommends that you continue on your current medications as directed. Please refer to the Current Medication list given to you today.   Labwork: none  Testing/Procedures: Your physician has requested that you have an exercise tolerance test. For further information please visit [email or web address] Please also follow instruction sheet, as given.  No caffeine (includes chocolate and decaffeinated beverages) or smoking 24 hours before the test.  Please wear comfortable walking shoes (ie., sneakers). No sandals, flip flop or smooth bottom shoes.    Follow-Up: Your physician recommends that you schedule a follow-up appointment as needed.    Any Other Special Instructions Will Be Listed Below (If Applicable).     If you need a refill on your cardiac medications before your next appointment, please call your pharmacy.   Exercise Stress Electrocardiogram An exercise stress electrocardiogram is a test that is done to evaluate the blood supply to your heart. This test may also be called exercise stress electrocardiography. The test is done while you are walking on a treadmill. The goal of this test is to raise your heart rate. This test is done to find areas of poor blood flow to the heart by determining the extent of coronary artery disease (CAD). CAD is defined as narrowing in one or more heart (coronary) arteries of more than 70%. If you have an abnormal test result, this may mean that you are not getting adequate blood flow to your heart during exercise. Additional testing may be needed to understand why your test was abnormal. Tell a health care provider about:  Any allergies you have.  All medicines you are taking, including vitamins, herbs, eye drops, creams, and over-the-counter medicines.  Any problems you or family members have had with anesthetic medicines.  Any blood disorders you have.  Any surgeries you have had.  Any medical  conditions you have.  Possibility of pregnancy, if this applies. What are the risks? Generally, this is a safe procedure. However, as with any procedure, complications can occur. Possible complications can include:  Pain or pressure in the following areas: ? Chest. ? Jaw or neck. ? Between your shoulder blades. ? Radiating down your left arm.  Dizziness or light-headedness.  Shortness of breath.  Increased or irregular heartbeats.  Nausea or vomiting.  Heart attack (rare).  What happens before the procedure?  Avoid all forms of caffeine 24 hours before your test or as directed by your health care provider. This includes coffee, tea (even decaffeinated tea), caffeinated sodas, chocolate, cocoa, and certain pain medicines.  Follow your health care provider's instructions regarding eating and drinking before the test.  Take your medicines as directed at regular times with water unless instructed otherwise. Exceptions may include: ? If you have diabetes, ask how you are to take your insulin or pills. It is common to adjust insulin dosing the morning of the test. ? If you are taking beta-blocker medicines, it is important to talk to your health care provider about these medicines well before the date of your test. Taking beta-blocker medicines may interfere with the test. In some cases, these medicines need to be changed or stopped 24 hours or more before the test. ? If you wear a nitroglycerin patch, it may need to be removed prior to the test. Ask your health care provider if the patch should be removed before the test.  If you use an inhaler for any breathing condition, bring it with you to the test.  If you are an  outpatient, bring a snack so you can eat right after the stress phase of the test.  Do not smoke for 4 hours prior to the test or as directed by your health care provider.  Do not apply lotions, powders, creams, or oils on your chest prior to the test.  Wear  loose-fitting clothes and comfortable shoes for the test. This test involves walking on a treadmill. What happens during the procedure?  Multiple patches (electrodes) will be put on your chest. If needed, small areas of your chest may have to be shaved to get better contact with the electrodes. Once the electrodes are attached to your body, multiple wires will be attached to the electrodes and your heart rate will be monitored.  Your heart will be monitored both at rest and while exercising.  You will walk on a treadmill. The treadmill will be started at a slow pace. The treadmill speed and incline will gradually be increased to raise your heart rate. What happens after the procedure?  Your heart rate and blood pressure will be monitored after the test.  You may return to your normal schedule including diet, activities, and medicines, unless your health care provider tells you otherwise. This information is not intended to replace advice given to you by your health care provider. Make sure you discuss any questions you have with your health care provider. Document Released: 11/05/2000 Document Revised: 04/15/2016 Document Reviewed: 07/16/2013 Elsevier Interactive Patient Education  2017 [employer].

## 2017-09-01 NOTE — Progress Notes (Signed)
Cardiology Office Note   Date:  09/01/2017   ID:  Adam Berger, DOB Jun 23, 1976, MRN [id number]  PCP:  Eustaquio Boyden, MD  Cardiologist:   Lorine Bears, MD   Chief Complaint  Patient presents with  . OTHER    F/u ED chest pain. Meds reviewed verbally with pt.      History of Present Illness: Adam Berger is a 41 y.o. male who was referred by Dr. Sharen Hones for evaluation of chest pain.the patient has no previous cardiac history and has been healthy throughout his life. He is not a smoker. Over the last 2 months, he has experienced intermittent episodes of substernal chest tightness and heaviness which usually happens at rest and last for one or 2 hours. The pain does not radiate to his arm, neck or jaw. He denies burning sensation and does not think the symptoms are related to GERD. The discomfort happens at rest and not with physical activities. He reports being under stress lately. He works as a Education officer, environmental. He is usually active and exercises on a regular basis. He usually runs 2 or 3 times every week but he has not done so in the last 2 weeks. On Sunday, he had a prolonged episode of chest pain and thus it was suggested to have to go to urgent care which she did and then he was transferred to the emergency room. Cardiac enzymes were negative. EKG showed no acute changes. Chest x-ray was unremarkable.  He does not have strong family history of premature coronary artery disease. His grandfather did die of myocardial infarction in his 71s. However, he was a smoker and overweight.  History reviewed. No pertinent past medical history.  Past Surgical History:  Procedure Laterality Date  . CYST EXCISION  2005   nasal cavity  . INGUINAL HERNIA REPAIR Bilateral 04/2014   with mesh [facility])  . VASECTOMY  04/2014   [facility])     Current Outpatient Prescriptions  Medication Sig Dispense Refill  . aspirin EC 81 MG tablet Take 1 tablet (81 mg total) by mouth daily.     No current  facility-administered medications for this visit.     Allergies:   Monkey-derived products and Penicillins    Social History:  The patient  reports that he has never smoked. He has never used smokeless tobacco. He reports that he does not drink alcohol or use drugs.   Family History:  The patient's family history includes CAD in his maternal grandfather; CAD (age of onset: 46) in his paternal grandfather; Cancer in his father; Diabetes in his maternal grandfather; Heart attack in his maternal grandfather.    ROS:  Please see the history of present illness.   Otherwise, review of systems are positive for none.   All other systems are reviewed and negative.    PHYSICAL EXAM: VS:  BP 110/76 (BP Location: Right Arm, Patient Position: Sitting, Cuff Size: Large)   Pulse 61   Ht 6' (1.829 m)   Wt 206 lb 12 oz (93.8 kg)   BMI 28.04 kg/m  , BMI Body mass index is 28.04 kg/m. GEN: Well nourished, well developed, in no acute distress  HEENT: normal  Neck: no JVD, carotid bruits, or masses Cardiac: RRR; no murmurs, rubs, or gallops,no edema  Respiratory:  clear to auscultation bilaterally, normal work of breathing GI: soft, nontender, nondistended, + BS MS: no deformity or atrophy  Skin: warm and dry, no rash Neuro:  Strength and sensation are intact Psych: euthymic mood,  full affect   EKG:  EKG is ordered today. The ekg ordered today demonstrates normal sinus rhythm with no significant ST or T wave changes.   Recent Labs: 01/13/2017: ALT 25 08/30/2017: BUN 22; Creatinine, Ser 1.31; Potassium 4.7; Sodium 139; TSH 1.79    Lipid Panel    Component Value Date/Time   CHOL 206 (H) 11/26/2016 1004   TRIG 68.0 11/26/2016 1004   HDL 43.80 11/26/2016 1004   CHOLHDL 5 11/26/2016 1004   VLDL 13.6 11/26/2016 1004   LDLCALC 149 (H) 11/26/2016 1004   LDLDIRECT 129.0 08/30/2017 1716      Wt Readings from Last 3 Encounters:  09/01/17 206 lb 12 oz (93.8 kg)  08/30/17 205 lb 8 oz (93.2 kg)    08/28/17 205 lb (93 kg)        PAD Screen 09/01/2017  Previous PAD dx? No  Previous surgical procedure? No  Pain with walking? No  Feet/toe relief with dangling? No  Painful, non-healing ulcers? No  Extremities discolored? No      ASSESSMENT AND PLAN:  1.  Atypical chest pain:  mostly nonexertional with reassuring workup so far. Cardiac exam is unremarkable and ECG does not show any ischemic changes.Given that he has some risk factors for coronary artery disease, I agree with a treadmill stress test which will be arranged. Otherwise continue healthy lifestyle changes. I don't have an alternative diagnosis for his chest pain which might be stress induced.    Disposition:   FU with me as needed.   Signed,  Lorine Bears, MD  09/01/2017 3:14 PM    [facility]

## 2017-09-05 ENCOUNTER — Ambulatory Visit (INDEPENDENT_AMBULATORY_CARE_PROVIDER_SITE_OTHER): Payer: BLUE CROSS/BLUE SHIELD

## 2017-09-05 DIAGNOSIS — R079 Chest pain, unspecified: Secondary | ICD-10-CM

## 2017-09-05 LAB — EXERCISE TOLERANCE TEST
CHL CUP MPHR: 181 {beats}/min
CHL CUP RESTING HR STRESS: 76 {beats}/min
Estimated workload: 11.7 METS
Exercise duration (min): 10 min
Exercise duration (sec): 1 s
Peak HR: 162 {beats}/min
Percent HR: 89 %

## 2017-11-27 ENCOUNTER — Other Ambulatory Visit: Payer: Self-pay | Admitting: Family Medicine

## 2017-11-27 DIAGNOSIS — E785 Hyperlipidemia, unspecified: Secondary | ICD-10-CM

## 2017-11-28 ENCOUNTER — Other Ambulatory Visit (INDEPENDENT_AMBULATORY_CARE_PROVIDER_SITE_OTHER): Payer: BLUE CROSS/BLUE SHIELD

## 2017-11-28 DIAGNOSIS — E785 Hyperlipidemia, unspecified: Secondary | ICD-10-CM | POA: Diagnosis not present

## 2017-11-28 LAB — LIPID PANEL
CHOL/HDL RATIO: 5
CHOLESTEROL: 195 mg/dL (ref 0–200)
HDL: 39.7 mg/dL (ref >39.00)
LDL CALC: 132 mg/dL — AB (ref 0–99)
NonHDL: 155
TRIGLYCERIDES: 116 mg/dL (ref 0.0–149.0)
VLDL: 23.2 mg/dL (ref 0.0–40.0)

## 2017-11-28 LAB — BASIC METABOLIC PANEL
BUN: 19 mg/dL (ref 6–23)
CHLORIDE: 105 meq/L (ref 96–112)
CO2: 31 mEq/L (ref 19–32)
CREATININE: 1.21 mg/dL (ref 0.40–1.50)
Calcium: 9.6 mg/dL (ref 8.4–10.5)
GFR: 70.73 mL/min (ref >60.00)
Glucose, Bld: 95 mg/dL (ref 70–99)
POTASSIUM: 4.9 meq/L (ref 3.5–5.1)
Sodium: 141 mEq/L (ref 135–145)

## 2017-11-30 ENCOUNTER — Encounter: Payer: Self-pay | Admitting: Family Medicine

## 2017-11-30 ENCOUNTER — Ambulatory Visit (INDEPENDENT_AMBULATORY_CARE_PROVIDER_SITE_OTHER): Payer: BLUE CROSS/BLUE SHIELD | Admitting: Family Medicine

## 2017-11-30 VITALS — BP 118/64 | HR 66 | Temp 98.2°F | Ht 71.5 in | Wt 213.0 lb

## 2017-11-30 DIAGNOSIS — B351 Tinea unguium: Secondary | ICD-10-CM | POA: Diagnosis not present

## 2017-11-30 DIAGNOSIS — Z0000 Encounter for general adult medical examination without abnormal findings: Secondary | ICD-10-CM

## 2017-11-30 MED ORDER — CICLOPIROX 8 % EX SOLN: Freq: Every day | CUTANEOUS | 3 refills | Status: DC

## 2017-11-30 NOTE — Patient Instructions (Addendum)
Try ciclopridox lacquer for great toenails. Return as needed or in 1-2 yrs for next physical.   Health Maintenance, Male A healthy lifestyle and preventive care is important for your health and wellness. Ask your health care provider about what schedule of regular examinations is right for you. What should I know about weight and diet? Eat a Healthy Diet  Eat plenty of vegetables, fruits, whole grains, low-fat dairy products, and lean protein.  Do not eat a lot of foods high in solid fats, added sugars, or salt.  Maintain a Healthy Weight Regular exercise can help you achieve or maintain a healthy weight. You should:  Do at least 150 minutes of exercise each week. The exercise should increase your heart rate and make you sweat (moderate-intensity exercise).  Do strength-training exercises at least twice a week.  Watch Your Levels of Cholesterol and Blood Lipids  Have your blood tested for lipids and cholesterol every 5 years starting at 40 years of age. If you are at high risk for heart disease, you should start having your blood tested when you are 40 years old. You may need to have your cholesterol levels checked more often if: ? Your lipid or cholesterol levels are high. ? You are older than 40 years of age. ? You are at high risk for heart disease.  What should I know about cancer screening? Many types of cancers can be detected early and may often be prevented. Lung Cancer  You should be screened every year for lung cancer if: ? You are a current smoker who has smoked for at least 30 years. ? You are a former smoker who has quit within the past 15 years.  Talk to your health care provider about your screening options, when you should start screening, and how often you should be screened.  Colorectal Cancer  Routine colorectal cancer screening usually begins at 40 years of age and should be repeated every 5-10 years until you are 40 years old. You may need to be screened  more often if early forms of precancerous polyps or small growths are found. Your health care provider may recommend screening at an earlier age if you have risk factors for colon cancer.  Your health care provider may recommend using home test kits to check for hidden blood in the stool.  A small camera at the end of a tube can be used to examine your colon (sigmoidoscopy or colonoscopy). This checks for the earliest forms of colorectal cancer.  Prostate and Testicular Cancer  Depending on your age and overall health, your health care provider may do certain tests to screen for prostate and testicular cancer.  Talk to your health care provider about any symptoms or concerns you have about testicular or prostate cancer.  Skin Cancer  Check your skin from head to toe regularly.  Tell your health care provider about any new moles or changes in moles, especially if: ? There is a change in a mole's size, shape, or color. ? You have a mole that is larger than a pencil eraser.  Always use sunscreen. Apply sunscreen liberally and repeat throughout the day.  Protect yourself by wearing long sleeves, pants, a wide-brimmed hat, and sunglasses when outside.  What should I know about heart disease, diabetes, and high blood pressure?  If you are 6618-40 years of age, have your blood pressure checked every 3-5 years. If you are 40 years of age or older, have your blood pressure checked every year. You  should have your blood pressure measured twice-once when you are at a hospital or clinic, and once when you are not at a hospital or clinic. Record the average of the two measurements. To check your blood pressure when you are not at a hospital or clinic, you can use: ? An automated blood pressure machine at a pharmacy. ? A home blood pressure monitor.  Talk to your health care provider about your target blood pressure.  If you are between 84-11 years old, ask your health care provider if you should  take aspirin to prevent heart disease.  Have regular diabetes screenings by checking your fasting blood sugar level. ? If you are at a normal weight and have a low risk for diabetes, have this test once every three years after the age of 51. ? If you are overweight and have a high risk for diabetes, consider being tested at a younger age or more often.  A one-time screening for abdominal aortic aneurysm (AAA) by ultrasound is recommended for men aged 2-75 years who are current or former smokers. What should I know about preventing infection? Hepatitis B If you have a higher risk for hepatitis B, you should be screened for this virus. Talk with your health care provider to find out if you are at risk for hepatitis B infection. Hepatitis C Blood testing is recommended for:  Everyone born from [birth year] through 1963.  Anyone with known risk factors for hepatitis C.  Sexually Transmitted Diseases (STDs)  You should be screened each year for STDs including gonorrhea and chlamydia if: ? You are sexually active and are younger than 40 years of age. ? You are older than 40 years of age and your health care provider tells you that you are at risk for this type of infection. ? Your sexual activity has changed since you were last screened and you are at an increased risk for chlamydia or gonorrhea. Ask your health care provider if you are at risk.  Talk with your health care provider about whether you are at high risk of being infected with HIV. Your health care provider may recommend a prescription medicine to help prevent HIV infection.  What else can I do?  Schedule regular health, dental, and eye exams.  Stay current with your vaccines (immunizations).  Do not use any tobacco products, such as cigarettes, chewing tobacco, and e-cigarettes. If you need help quitting, ask your health care provider.  Limit alcohol intake to no more than 2 drinks per day. One drink equals 12 ounces of beer, 5  ounces of wine, or 1 ounces of hard liquor.  Do not use street drugs.  Do not share needles.  Ask your health care provider for help if you need support or information about quitting drugs.  Tell your health care provider if you often feel depressed.  Tell your health care provider if you have ever been abused or do not feel safe at home. This information is not intended to replace advice given to you by your health care provider. Make sure you discuss any questions you have with your health care provider. Document Released: 05/06/2008 Document Revised: 07/07/2016 Document Reviewed: 08/12/2015 Elsevier Interactive Patient Education  [employer].

## 2017-11-30 NOTE — Assessment & Plan Note (Signed)
Terbinafine was effective but did not fully resolve onychomycosis. Side effects intolerable. Discussed trial oral itraconazole vs topical therapy. Pt opts to try topical therapy - Rx for ciclopirox printed out for patient. Update with effect.

## 2017-11-30 NOTE — Assessment & Plan Note (Signed)
Preventative protocols reviewed and updated unless pt declined. Discussed healthy diet and lifestyle.  

## 2017-11-30 NOTE — Progress Notes (Signed)
BP 118/64 (BP Location: Left Arm, Patient Position: Sitting, Cuff Size: Normal)   Pulse 66   Temp 98.2 F (36.8 C) (Oral)   Ht 5' 11.5" (1.816 m)   Wt 213 lb (96.6 kg)   SpO2 95%   BMI 29.29 kg/m    CC: CPE Subjective:    Patient ID: Adam Berger, male    DOB: 04/29/1976, 42 y.o.   MRN: [id number]  HPI: Adam Berger is a 42 y.o. male presenting on 11/30/2017 for Annual Exam   Onychomycosis - s/p oral terbinafine. Terbinafine caused taste loss - didn't return until after several months.  Episode of substernal chest pain s/p ER visit then normal exercise treadmill test Kirke Corin([place]).  Cracked tooth L upper molar - pending dental appt.   Preventative: Flu shot declined Tdap 2018 Seat belt use discussed Sunscreen use discussed. 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 Interim medical history since our last visit reviewed. Allergies and medications reviewed and updated. Outpatient Medications Prior to Visit  Medication Sig Dispense Refill  . aspirin EC 81 MG tablet Take 1 tablet (81 mg total) by mouth daily.     No facility-administered medications prior to visit.      Per HPI unless specifically indicated in ROS section below Review of Systems  Constitutional: Negative for activity change, appetite change, chills, fatigue, fever and unexpected weight change.  HENT: Negative for hearing loss.   Eyes: Negative for visual disturbance.  Respiratory: Negative for cough, chest tightness, shortness of breath and wheezing.   Cardiovascular: Negative for chest pain, palpitations and leg swelling.  Gastrointestinal: Negative for abdominal distention, abdominal pain, blood in stool, constipation, diarrhea,  nausea and vomiting.  Genitourinary: Negative for difficulty urinating and hematuria.  Musculoskeletal: Negative for arthralgias, myalgias and neck pain.  Skin: Negative for rash.  Neurological: Negative for dizziness, seizures, syncope and headaches.  Hematological: Negative for adenopathy. Does not bruise/bleed easily.  Psychiatric/Behavioral: Negative for dysphoric mood. The patient is not nervous/anxious.        Objective:    BP 118/64 (BP Location: Left Arm, Patient Position: Sitting, Cuff Size: Normal)   Pulse 66   Temp 98.2 F (36.8 C) (Oral)   Ht 5' 11.5" (1.816 m)   Wt 213 lb (96.6 kg)   SpO2 95%   BMI 29.29 kg/m   Wt Readings from Last 3 Encounters:  11/30/17 213 lb (96.6 kg)  09/01/17 206 lb 12 oz (93.8 kg)  08/30/17 205 lb 8 oz (93.2 kg)    Physical Exam  Constitutional: He is oriented to person, place, and time. He appears well-developed and well-nourished. No distress.  HENT:  Head: Normocephalic and atraumatic.  Right Ear: Hearing, tympanic membrane, external ear and ear canal normal.  Left Ear: Hearing, tympanic membrane, external ear and ear canal normal.  Nose: Nose normal.  Mouth/Throat: Uvula is midline, oropharynx is clear and moist and mucous membranes are normal. No oropharyngeal exudate, posterior oropharyngeal edema or posterior oropharyngeal erythema.  Eyes: Conjunctivae and EOM are normal. Pupils are equal, round, and reactive to light. No scleral icterus.  R eye with 2 small iris nevi  Neck: Normal range of motion. Neck supple. No thyromegaly present.  Cardiovascular: Normal rate, regular rhythm, normal heart sounds and intact  distal pulses.  No murmur heard. Pulses:      Radial pulses are 2+ on the right side, and 2+ on the left side.  Pulmonary/Chest: Effort normal and breath sounds normal. No respiratory distress. He has no wheezes. He has no rales.  Abdominal: Soft. Bowel sounds are normal. He exhibits no distension and no mass. There is no  tenderness. There is no rebound and no guarding.  Musculoskeletal: Normal range of motion. He exhibits no edema.  Lymphadenopathy:    He has no cervical adenopathy.  Neurological: He is alert and oriented to person, place, and time.  CN grossly intact, station and gait intact  Skin: Skin is warm and dry. No rash noted.  Psychiatric: He has a normal mood and affect. His behavior is normal. Judgment and thought content normal.  Nursing note and vitals reviewed.  Results for orders placed or performed in visit on 11/28/17  Basic metabolic panel  Result Value Ref Range   Sodium 141 135 - 145 mEq/L   Potassium 4.9 3.5 - 5.1 mEq/L   Chloride 105 96 - 112 mEq/L   CO2 31 19 - 32 mEq/L   Glucose, Bld 95 70 - 99 mg/dL   BUN 19 6 - 23 mg/dL   Creatinine, Ser 1.61 0.40 - 1.50 mg/dL   Calcium 9.6 8.4 - 09.6 mg/dL   GFR 04.54 >09.81 mL/min  Lipid panel  Result Value Ref Range   Cholesterol 195 0 - 200 mg/dL   Triglycerides 191.4 0.0 - 149.0 mg/dL   HDL 78.29 >56.21 mg/dL   VLDL 30.8 0.0 - 65.7 mg/dL   LDL Cholesterol 846 (H) 0 - 99 mg/dL   Total CHOL/HDL Ratio 5    NonHDL 155.00       Assessment & Plan:   Problem List Items Addressed This Visit    Healthcare maintenance - Primary    Preventative protocols reviewed and updated unless pt declined. Discussed healthy diet and lifestyle.       Onychomycosis    Terbinafine was effective but did not fully resolve onychomycosis. Side effects intolerable. Discussed trial oral itraconazole vs topical therapy. Pt opts to try topical therapy - Rx for ciclopirox printed out for patient. Update with effect.      Relevant Medications   ciclopirox (PENLAC) 8 % solution       Follow up plan: Return in about 1 year (around 11/30/2018) for annual exam, prior fasting for blood work.  Eustaquio Boyden, MD

## 2018-05-31 ENCOUNTER — Telehealth: Payer: Self-pay | Admitting: Family Medicine

## 2018-05-31 NOTE — Telephone Encounter (Signed)
Left message for pt to call back.  Need to relay Dr. G's message.  

## 2018-05-31 NOTE — Telephone Encounter (Signed)
Copied from [place] [id number]. Topic: Quick Communication - See Telephone Encounter >> May 31, 2018  1:01 PM Hewitt Shorts wrote: Pt is getting ready to go out of the country and is needing to confirm that he is up to date on his Tetanus and MMR and if so he needs then documentation sent to home address   Best number [phone]

## 2018-05-31 NOTE — Telephone Encounter (Signed)
Tdap UTD. Printed report and in Lisa's box. We don't have childhood immunization records for MMR - he needs to check at home on childhood vaccines.  Given birth year, he likely received only 1 MMR vaccine. He would be eligible for 2nd MMR vaccine in preparation for travel, however insurance is not covering this very well - would recommend he call to verify coverage and if desired may come in for MMR.

## 2018-06-02 ENCOUNTER — Ambulatory Visit (INDEPENDENT_AMBULATORY_CARE_PROVIDER_SITE_OTHER): Payer: BLUE CROSS/BLUE SHIELD | Admitting: Internal Medicine

## 2018-06-02 DIAGNOSIS — Z23 Encounter for immunization: Secondary | ICD-10-CM | POA: Diagnosis not present

## 2018-06-02 DIAGNOSIS — Z7189 Other specified counseling: Secondary | ICD-10-CM | POA: Diagnosis not present

## 2018-06-02 DIAGNOSIS — Z7184 Encounter for health counseling related to travel: Secondary | ICD-10-CM

## 2018-06-02 DIAGNOSIS — Z789 Other specified health status: Secondary | ICD-10-CM

## 2018-06-02 DIAGNOSIS — Z9189 Other specified personal risk factors, not elsewhere classified: Secondary | ICD-10-CM | POA: Diagnosis not present

## 2018-06-02 MED ORDER — TYPHOID VACCINE PO CPDR: 1.0000 | DELAYED_RELEASE_CAPSULE | ORAL | 0 refills | Status: DC

## 2018-06-02 NOTE — Progress Notes (Signed)
Subjective:   Adam CondonJoshua Berger is a 42 y.o. male who presents to the Infectious Disease clinic for travel consultation. Planned departure date: august 4th      Planned return date: august 10th Countries of travel: [place] Areas in country: urban Accommodations: friends house Purpose of travel: church Prior travel out of [place]: yes, has been to [place], [place], [place]     Objective:   Medications:none All: pcn    Assessment:   No contraindications to travel. none   Plan:   Pre travel vaccines = giving hep A and oral typhoid vaccine. Already has YF vaccine  Traveler's diarrhea = gave precautions. Has azithromycin rx  Mosquito bite prevention = gave recs for deet and premethrin

## 2018-06-02 NOTE — Telephone Encounter (Signed)
Mailed a letter

## 2019-07-02 ENCOUNTER — Other Ambulatory Visit: Payer: Self-pay

## 2019-07-02 DIAGNOSIS — Z20822 Contact with and (suspected) exposure to covid-19: Secondary | ICD-10-CM

## 2019-07-02 NOTE — Progress Notes (Unsigned)
[id number] 

## 2019-07-03 LAB — NOVEL CORONAVIRUS, NAA: SARS-CoV-2, NAA: NOT DETECTED

## 2021-07-06 ENCOUNTER — Telehealth: Payer: Self-pay | Admitting: Family Medicine

## 2021-07-06 NOTE — Telephone Encounter (Signed)
[name] called in wanted to know if Dr. Reece Agar would see Adam Berger again as a patient. Also he is having shooting pain in his right eye. And he has went to the eye doctor and they dont see anything.

## 2021-07-06 NOTE — Telephone Encounter (Signed)
Mr. Conde called in returning phone call. Stated that he is out of town and he is scheduled for 8/23 @9 

## 2021-07-06 NOTE — Telephone Encounter (Signed)
Yes I can see him - can he come tomorrow at 8:30am?

## 2021-07-14 ENCOUNTER — Ambulatory Visit: Payer: BLUE CROSS/BLUE SHIELD | Admitting: Family Medicine

## 2021-07-14 ENCOUNTER — Other Ambulatory Visit: Payer: Self-pay

## 2021-07-14 ENCOUNTER — Encounter: Payer: Self-pay | Admitting: Family Medicine

## 2021-07-14 VITALS — BP 118/64 | HR 76 | Temp 98.2°F | Ht 72.0 in | Wt 202.0 lb

## 2021-07-14 DIAGNOSIS — H5711 Ocular pain, right eye: Secondary | ICD-10-CM | POA: Diagnosis not present

## 2021-07-14 NOTE — Assessment & Plan Note (Addendum)
Unclear etiology for significant unilateral eye pain. Was associated with red eye however this may have come from self-inflicted irritation rubbing eye. Raises question of iritis, scleritis, however he had reassuring exam by optometrist while symptoms most active. Exam today overall normal. ?Dry eyes - however symptoms responded to benadryl. Not consistent with episcleritis (given predominant pain) or with conjunctivitis (no discharge).  Did not seem to respond to NSAIDs, tylenol, topical steroids. Did seem to respond most to benadryl and now oral steroid course. ?allergic however not consistent with allergic conjunctivitis.  He does not have any systemic rheum/neurological symptoms.  Would expect labs while on steroids of limited use. Discussed return for lab work if recurrent symptoms as prednisone taper ends (ANA, etc, and ESR r/o GCA). He agrees with plan.

## 2021-07-14 NOTE — Patient Instructions (Addendum)
Unclear cause of right eye pain as of now.  Continue steroid taper for now.  I have ordered blood work to have done if symptoms come back off steroids. Call us for lab visit if this happens.  Continue warm compresses.

## 2021-07-14 NOTE — Progress Notes (Signed)
Patient ID: Adam Berger, male    DOB: Apr 05, 1976, 45 y.o.   MRN: [id number]  This visit was conducted in person.  BP 118/64   Pulse 76   Temp 98.2 F (36.8 C) (Temporal)   Ht 6' (1.829 m)   Wt 202 lb (91.6 kg)   SpO2 97%   BMI 27.40 kg/m    Vision Screening   Right eye Left eye Both eyes  Without correction 20/13 20/13 20/13   With correction       CC: R eye pain Subjective:   HPI: Adam Berger is a 45 y.o. male presenting on 07/14/2021 for Eye Pain   Last seen 11/2017.  4 wk h/o R eye pain at nasal edge that woke him up from sleep 4am. Describes sharp severe shooting pain. 7-8/10 pain. Tried flushing eye without benefit. Pain has persisted for several weeks. R eye redness. Felt better to close his eye.   Saw eye doctor Dr Hester Mates optometrist in [place] - normal eye exam. No corneal abrasion, no styes, no glaucoma, perfect vision - no cause found.  Treated with steroid eye drop without benefit.  Tylenol/advil did not help symptoms.  He started using refresh artificial ears with some benefit.  He also tried allergy eye wipes and benadryl - the most benefit.  Also using warm compresses.  Then treated with steroid taper with significant improvement.  Went to beach last week - treating with benadryl.  Currently on prolonged steroid taper (56m) - plan is 30/20/10/541mweekly for 1 month.  Recent road trip to [place]onders if exposure to [place] may have caused symptoms.   Did have some rhinorrhea.  No fevers/chills, headache, ear or tooth pain, ST, new joint pains, rash, abd pain, nausea. No other GI symptoms.  No blurry vision, vision changes, no double vision, no eye discharge.  H/o nasal sinus cyst removal in college.   No fmhx inflammatory bowel disease.      Relevant past medical, surgical, family and social history reviewed and updated as indicated. Interim medical history since our last visit reviewed. Allergies and medications reviewed and  updated. Outpatient Medications Prior to Visit  Medication Sig Dispense Refill   [facility] OMEPRAZOLE 20 MG TBEC Take 1 tablet by mouth daily.     predniSONE (DELTASONE) 10 MG tablet Take by mouth.     ciclopirox (PENLAC) 8 % solution Apply topically at bedtime. Apply over nail and surrounding skin. Apply daily over previous coat. After seven (7) days, may remove with alcohol and continue cycle. 6.6 mL 3   typhoid (VIVOTIF) DR capsule Take 1 capsule by mouth every other day. Take on empty stomach with room temp water 4 capsule 0   No facility-administered medications prior to visit.     Per HPI unless specifically indicated in ROS section below Review of Systems  Objective:  BP 118/64   Pulse 76   Temp 98.2 F (36.8 C) (Temporal)   Ht 6' (1.829 m)   Wt 202 lb (91.6 kg)   SpO2 97%   BMI 27.40 kg/m   Wt Readings from Last 3 Encounters:  07/14/21 202 lb (91.6 kg)  11/30/17 213 lb (96.6 kg)  09/01/17 206 lb 12 oz (93.8 kg)      Physical Exam Vitals and nursing note reviewed.  Constitutional:      Appearance: Normal appearance. He is not ill-appearing.  HENT:     Head: Normocephalic and atraumatic.     Comments: No temporal pain  to palpation     Right Ear: Hearing normal.     Left Ear: Hearing normal.     Nose: Nose normal. No mucosal edema, congestion or rhinorrhea.     Right Turbinates: Not enlarged or swollen.     Left Turbinates: Not enlarged or swollen.     Right Sinus: No maxillary sinus tenderness or frontal sinus tenderness.     Left Sinus: No maxillary sinus tenderness or frontal sinus tenderness.     Mouth/Throat:     Mouth: Mucous membranes are moist.     Pharynx: Oropharynx is clear. No oropharyngeal exudate or posterior oropharyngeal erythema.  Eyes:     General: Lids are normal.        Right eye: No discharge.        Left eye: No discharge.     Extraocular Movements: Extraocular movements intact.     Conjunctiva/sclera: Conjunctivae normal.     Right eye: Right  conjunctiva is not injected. No chemosis or exudate.    Left eye: Left conjunctiva is not injected. No chemosis or exudate.    Pupils: Pupils are equal, round, and reactive to light.     Comments: No papilledema appreciated on limited fundoscopic exam  Musculoskeletal:     Cervical back: Normal range of motion and neck supple.     Right lower leg: No edema.     Left lower leg: No edema.  Lymphadenopathy:     Cervical: No cervical adenopathy.  Skin:    General: Skin is warm and dry.     Findings: No rash.  Neurological:     Mental Status: He is alert.  Psychiatric:        Mood and Affect: Mood normal.        Behavior: Behavior normal.      Results for orders placed or performed in visit on 07/02/19  Novel Coronavirus, NAA ([facility])   Specimen: Oropharyngeal(OP) collection in vial transport medium   OROPHARYNGEA  TESTING  Result Value Ref Range   SARS-CoV-2, NAA Not Detected Not Detected    Assessment & Plan:  This visit occurred during the SARS-CoV-2 public health emergency.  Safety protocols were in place, including screening questions prior to the visit, additional usage of staff PPE, and extensive cleaning of exam room while observing appropriate contact time as indicated for disinfecting solutions.   Problem List Items Addressed This Visit     Acute pain in right eye - Primary    Unclear etiology for significant unilateral eye pain. Was associated with red eye however this may have come from self-inflicted irritation rubbing eye. Raises question of iritis, scleritis, however he had reassuring exam by optometrist while symptoms most active. Exam today overall normal. ?Dry eyes - however symptoms responded to benadryl. Not consistent with episcleritis (given predominant pain) or with conjunctivitis (no discharge).  Did not seem to respond to NSAIDs, tylenol, topical steroids. Did seem to respond most to benadryl and now oral steroid course. ?allergic however not consistent with  allergic conjunctivitis.  He does not have any systemic rheum/neurological symptoms.  Would expect labs while on steroids of limited use. Discussed return for lab work if recurrent symptoms as prednisone taper ends (ANA, etc, and ESR r/o GCA). He agrees with plan.       Relevant Orders   Sedimentation rate   C-reactive protein   CBC with Differential/Platelet   Comprehensive metabolic panel   TSH   ANA   Urinalysis, Routine w reflex microscopic  No orders of the defined types were placed in this encounter.  Orders Placed This Encounter  Procedures   Sedimentation rate    Standing Status:   Future    Standing Expiration Date:   07/14/2022   C-reactive protein    Standing Status:   Future    Standing Expiration Date:   07/14/2022   CBC with Differential/Platelet    Standing Status:   Future    Standing Expiration Date:   07/14/2022   Comprehensive metabolic panel    Standing Status:   Future    Standing Expiration Date:   07/14/2022   TSH    Standing Status:   Future    Standing Expiration Date:   07/14/2022   ANA    Standing Status:   Future    Standing Expiration Date:   07/14/2022   Urinalysis, Routine w reflex microscopic    Standing Status:   Future    Standing Expiration Date:   07/14/2022      Patient Instructions  Unclear cause of right eye pain as of now.  Continue steroid taper for now.  I have ordered blood work to have done if symptoms come back off steroids. Call us for lab visit if this happens.  Continue warm compresses.   Follow up plan: Return if symptoms worsen or fail to improve.  Ria Bush, MD

## 2021-08-17 ENCOUNTER — Telehealth: Payer: Self-pay | Admitting: Family Medicine

## 2021-08-17 DIAGNOSIS — H5711 Ocular pain, right eye: Secondary | ICD-10-CM

## 2021-08-17 MED ORDER — OMEPRAZOLE 20 MG PO CPDR: 20.0000 mg | DELAYED_RELEASE_CAPSULE | Freq: Every day | ORAL | 1 refills | Status: DC

## 2021-08-17 MED ORDER — NAPROXEN 500 MG PO TABS: ORAL_TABLET | ORAL | 1 refills | Status: DC

## 2021-08-17 NOTE — Telephone Encounter (Addendum)
Spoke with Dr Valora Piccolo optometrist.  Finishing prednisone taper, pain has recurred.  Eye exam overall normal, vision maintained. ?isolated CN4 inflammation (trochleitis). Anticipate systemic condition - either neurologic or rheumatologic.  Will add RF and thyroid panel and refer to neurology.   Rec oral NSAIDs in place of prednisone course. Will Rx naprosyn 500mg  BID.   Please call patient - ask him to come in for labwork at his convenience for further evaluation of ongoing eye pain. Also recommend he try naprosyn course, along with continuing omeprazole for stomach protection. I have refilled both. Also notify I've referred him to neurology for further evaluation.

## 2021-08-19 ENCOUNTER — Other Ambulatory Visit (INDEPENDENT_AMBULATORY_CARE_PROVIDER_SITE_OTHER): Payer: 59

## 2021-08-19 ENCOUNTER — Other Ambulatory Visit: Payer: Self-pay

## 2021-08-19 DIAGNOSIS — H5711 Ocular pain, right eye: Secondary | ICD-10-CM

## 2021-08-20 LAB — URINALYSIS, ROUTINE W REFLEX MICROSCOPIC
Bilirubin Urine: NEGATIVE
Hgb urine dipstick: NEGATIVE
Ketones, ur: NEGATIVE
Leukocytes,Ua: NEGATIVE
Nitrite: NEGATIVE
RBC / HPF: NONE SEEN (ref 0–?)
Specific Gravity, Urine: 1.015 (ref 1.000–1.030)
Total Protein, Urine: NEGATIVE
Urine Glucose: NEGATIVE
Urobilinogen, UA: 0.2 (ref 0.0–1.0)
WBC, UA: NONE SEEN (ref 0–?)
pH: 6.5 (ref 5.0–8.0)

## 2021-08-20 LAB — CBC WITH DIFFERENTIAL/PLATELET
Basophils Absolute: 0 10*3/uL (ref 0.0–0.1)
Basophils Relative: 0.6 % (ref 0.0–3.0)
Eosinophils Absolute: 0 10*3/uL (ref 0.0–0.7)
Eosinophils Relative: 0.5 % (ref 0.0–5.0)
HCT: 41 % (ref 39.0–52.0)
Hemoglobin: 13.7 g/dL (ref 13.0–17.0)
Lymphocytes Relative: 27.7 % (ref 12.0–46.0)
Lymphs Abs: 1.4 10*3/uL (ref 0.7–4.0)
MCHC: 33.3 g/dL (ref 30.0–36.0)
MCV: 85.6 fl (ref 78.0–100.0)
Monocytes Absolute: 0.5 10*3/uL (ref 0.1–1.0)
Monocytes Relative: 9 % (ref 3.0–12.0)
Neutro Abs: 3.1 10*3/uL (ref 1.4–7.7)
Neutrophils Relative %: 62.2 % (ref 43.0–77.0)
Platelets: 214 10*3/uL (ref 150.0–400.0)
RBC: 4.79 Mil/uL (ref 4.22–5.81)
RDW: 13.9 % (ref 11.5–15.5)
WBC: 5.1 10*3/uL (ref 4.0–10.5)

## 2021-08-20 LAB — COMPREHENSIVE METABOLIC PANEL
ALT: 15 U/L (ref 0–53)
AST: 15 U/L (ref 0–37)
Albumin: 4.3 g/dL (ref 3.5–5.2)
Alkaline Phosphatase: 40 U/L (ref 39–117)
BUN: 20 mg/dL (ref 6–23)
CO2: 31 mEq/L (ref 19–32)
Calcium: 9.6 mg/dL (ref 8.4–10.5)
Chloride: 104 mEq/L (ref 96–112)
Creatinine, Ser: 1.28 mg/dL (ref 0.40–1.50)
GFR: 68.62 mL/min (ref >60.00)
Glucose, Bld: 81 mg/dL (ref 70–99)
Potassium: 4.5 mEq/L (ref 3.5–5.1)
Sodium: 140 mEq/L (ref 135–145)
Total Bilirubin: 0.7 mg/dL (ref 0.2–1.2)
Total Protein: 6.7 g/dL (ref 6.0–8.3)

## 2021-08-20 LAB — TSH: TSH: 2.57 u[IU]/mL (ref 0.35–5.50)

## 2021-08-20 LAB — C-REACTIVE PROTEIN: CRP: <1 mg/dL (ref 0.5–20.0)

## 2021-08-20 LAB — THYROID PEROXIDASE ANTIBODIES (TPO) (REFL): Thyroperoxidase Ab SerPl-aCnc: <1 IU/mL (ref <9)

## 2021-08-20 LAB — SEDIMENTATION RATE: Sed Rate: 5 mm/hr (ref 0–15)

## 2021-08-21 LAB — THYROID STIMULATING IMMUNOGLOBULIN: TSI: <89 % baseline (ref <140)

## 2021-08-21 LAB — ANA: Anti Nuclear Antibody (ANA): NEGATIVE

## 2021-08-21 LAB — RHEUMATOID FACTOR: Rheumatoid fact SerPl-aCnc: <14 IU/mL (ref <14)

## 2021-08-24 ENCOUNTER — Encounter: Payer: Self-pay | Admitting: Neurology

## 2021-08-24 ENCOUNTER — Ambulatory Visit: Payer: 59 | Admitting: Neurology

## 2021-08-24 VITALS — BP 117/71 | HR 71 | Ht 72.0 in | Wt 200.0 lb

## 2021-08-24 DIAGNOSIS — H5711 Ocular pain, right eye: Secondary | ICD-10-CM | POA: Insufficient documentation

## 2021-08-24 MED ORDER — GABAPENTIN 300 MG PO CAPS: 300.0000 mg | ORAL_CAPSULE | Freq: Three times a day (TID) | ORAL | 6 refills | Status: DC

## 2021-08-24 NOTE — Progress Notes (Signed)
Chief Complaint  Patient presents with   New Patient (Initial Visit)    New rm, alone, here to discuss right eye pain      ASSESSMENT AND PLAN  Adam Berger is a 45 y.o. male   Right eye inner corner pain.   Normal neurological examination,  Pain-free now  Differentiation diagnosis include superior oblique tendon sheath syndrome, versus migraine variant  I have suggested him to taper off naproxen, to see if he has any recurrent pain, also gave him gabapentin 300 mg 3 times daily if he does have recurrent pain, also advised him to call clinic  We may consider imaging study if he continues to have pain without medications   DIAGNOSTIC DATA (LABS, IMAGING, TESTING) - I reviewed patient records, labs, notes, testing and imaging myself where available.   MEDICAL HISTORY:  Adam Berger is a 45 year old male, seen in request by his primary care doctor for evaluation Ria Bush for evaluation of right inner eye pain, initial evaluation was on August 24, 2021    I reviewed and summarized the referring note. PMHx.  In July 2022, after drove 2 consecutive 8 hours each day, he woke up next morning, noticed a sharp pain at the right eye inner corner, seems to involving right upper eyelid only, as if there is a foreign object in his eye, the pain was so severe, sharp 10 out of 10, constant, he reports some over-the-counter eyedrops, tried to flush it out without benefit, the stabbing pain last about 1 week,  He was seen by optometrist, there was no eye pathology found, then he found Benadryl was helping his symptoms, he was also giving steroid eyedrop without helping his symptoms  Later he was seen by his primary care doctor, was given a prescription of tapering dose of steroid from August to September, which did help his pain some, but after he stopped the steroids, he had recurrent pain, described as 6 out of 10  He was put on naproxen 500 mg twice daily, over the past week, he  is no longer have any significant pain,  He also had extensive laboratory evaluation on August 19, 2021, normal CBC, CMP, TSH, ESR, C-reactive protein, ANA, rheumatoid factor, thyroid peroxidase antibody, thyroid stimulating immunoglobulin all negative,  He denies difficulty moving his eyes, he denies double vision, denies droopy eyelid  He did have a history of right nasal cyst to remove in the past, 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  PHYSICAL EXAMNIATION:  Gen: NAD, conversant, well nourised, well groomed                     Cardiovascular: Regular rate rhythm, no peripheral edema, warm, nontender. Eyes: Conjunctivae clear without exudates or hemorrhage Neck: Supple, no carotid bruits. Pulmonary: Clear to auscultation bilaterally   NEUROLOGICAL EXAM:  MENTAL STATUS: Speech:    Speech is normal; fluent and spontaneous with normal comprehension.  Cognition:     Orientation to time, place and person     Normal recent and remote memory     Normal Attention span and concentration     Normal Language, naming, repeating,spontaneous speech     Fund of knowledge   CRANIAL NERVES: CN  II: Visual fields are full to confrontation. Pupils are round equal and briskly reactive to light. CN III, IV, VI: extraocular movement are normal. No ptosis. CN V: Facial sensation is intact to light touch, bilateral corneal reflexes were normal and symmetric CN VII: Face is symmetric with normal eye closure  CN VIII: Hearing is normal to causal conversation. CN IX, X: Phonation is normal. CN XI: Head turning and shoulder shrug are intact  MOTOR: There is no pronator drift of out-stretched arms. Muscle bulk and tone are normal. Muscle strength is normal.  REFLEXES: Reflexes are 2+ and  symmetric at the biceps, triceps, knees, and ankles. Plantar responses are flexor.  SENSORY: Intact to light touch, pinprick and vibratory sensation are intact in fingers and toes.  COORDINATION: There is no trunk or limb dysmetria noted.  GAIT/STANCE: Posture is normal. Gait is steady with normal steps, base, arm swing, and turning. Heel and toe walking are normal. Tandem gait is normal.  Romberg is absent.  REVIEW OF SYSTEMS:  Full 14 system review of systems performed and notable only for as above All other review of systems were negative.   ALLERGIES: Allergies  Allergen Reactions   Monkey-Derived Products Anaphylaxis    Small monkey exposure at [employer]   Penicillins Other (See Comments)    Unknown--Childhood   Terbinafine And Related Other (See Comments)    Taste loss    HOME MEDICATIONS: Current Outpatient Medications  Medication Sig Dispense Refill   naproxen (NAPROSYN) 500 MG tablet Take one po bid x 1 week then prn pain, take with food 60 tablet 1   No current facility-administered medications for this visit.    PAST MEDICAL HISTORY: History reviewed. No pertinent past medical history.  PAST SURGICAL HISTORY: Past Surgical History:  Procedure Laterality Date   CARDIOVASCULAR STRESS TEST  2018   exercise treadmill WNL ([place])   CYST EXCISION  2005   nasal cavity   INGUINAL HERNIA REPAIR Bilateral 04/2014   with mesh [facility])   VASECTOMY  04/2014   [facility])    FAMILY HISTORY: Family History  Problem Relation Age of Onset   Diabetes Maternal Grandfather    CAD Maternal Grandfather        MI later in age   Heart attack Maternal Grandfather    CAD Paternal Grandfather 31       MI (smoker)   Cancer Father        skin cancer on nose   Stroke Neg Hx    Hypertension Neg Hx     SOCIAL HISTORY: Social History   Socioeconomic History   Marital status: Married    Spouse name: Not on file   Number of children: Not on file   Years of education: Not  on file   Highest education level: Not on file  Occupational History   Not on file  Tobacco Use   Smoking status: Never   Smokeless tobacco: Never  Vaping Use   Vaping Use: Never used  Substance and Sexual Activity   Alcohol use: No   Drug use: No   Sexual activity: Not on file  Other Topics Concern   Not on file  Social History Narrative   Lives with wife and 4 children (9,6,4,2)   Occupation: pastor   Edu: BS   Activity: works out regularly (5:30am every morning), some cardio   Diet: good water, fruits/vegetables daily, fish   Social Determinants of Radio broadcast assistant Strain: Not on [employer]  Insecurity: Not on file  Transportation Needs: Not on file  Physical Activity: Not on file  Stress: Not on file  Social Connections: Not on file  Intimate Partner Violence: Not on file      Marcial Pacas, M.D. Ph.D.  [facility] [address] [place], [place] [address] Ph: [phone] Fax: [phone]  CC:  Ria Bush, MD [place],  [place] [address]  Ria Bush, MD

## 2021-11-24 LAB — TESTOSTERONE: Testosterone: 611

## 2021-11-24 LAB — CBC AND DIFFERENTIAL: HCT: 42 (ref 41–53)

## 2021-11-24 LAB — PSA: PSA: 0.9

## 2021-11-30 ENCOUNTER — Ambulatory Visit (INDEPENDENT_AMBULATORY_CARE_PROVIDER_SITE_OTHER): Payer: 59 | Admitting: Family Medicine

## 2021-11-30 ENCOUNTER — Encounter: Payer: Self-pay | Admitting: Family Medicine

## 2021-11-30 ENCOUNTER — Other Ambulatory Visit: Payer: Self-pay

## 2021-11-30 VITALS — BP 120/68 | HR 62 | Temp 97.9°F | Ht 72.0 in | Wt 207.0 lb

## 2021-11-30 DIAGNOSIS — R413 Other amnesia: Secondary | ICD-10-CM

## 2021-11-30 DIAGNOSIS — F5232 Male orgasmic disorder: Secondary | ICD-10-CM

## 2021-11-30 DIAGNOSIS — R7989 Other specified abnormal findings of blood chemistry: Secondary | ICD-10-CM | POA: Diagnosis not present

## 2021-11-30 DIAGNOSIS — H5711 Ocular pain, right eye: Secondary | ICD-10-CM | POA: Diagnosis not present

## 2021-11-30 LAB — VITAMIN B12: Vitamin B-12: 631 pg/mL (ref 211–911)

## 2021-11-30 LAB — T4, FREE: Free T4: 0.95 ng/dL (ref 0.60–1.60)

## 2021-11-30 LAB — TSH: TSH: 2.07 u[IU]/mL (ref 0.35–5.50)

## 2021-11-30 LAB — FOLLICLE STIMULATING HORMONE: FSH: 5.6 m[IU]/mL (ref 1.4–18.1)

## 2021-11-30 LAB — LUTEINIZING HORMONE: LH: 6.89 m[IU]/mL (ref 1.50–9.30)

## 2021-11-30 NOTE — Patient Instructions (Addendum)
Labs today We will set you up for brain MRI as well.  We will be in touch with results.  CPT code for MRI brain with and without contrast:  70553 Indication - elevated prolactin level (R79.89)

## 2021-11-30 NOTE — Assessment & Plan Note (Addendum)
Mildly elevated levels x2 (27) on testing last week.  Also endorses some recent memory deficits and anorgasmia/anejaculation.  With elevated prolactin, will check brain MRI w and w/o contrast to evaluate pituitary/hypothalamus region r/o pituitary adenoma.  Will check further labwork including 1:100 diluted prolactin to r/o hook effect.  Will be in touch with results of workup.  If all normal, ?idiopathic hyperprolactinemia.

## 2021-11-30 NOTE — Progress Notes (Signed)
Patient ID: Adam Berger, male    DOB: 22-Oct-1976, 46 y.o.   MRN: [id number]  This visit was conducted in person.  BP 120/68    Pulse 62    Temp 97.9 F (36.6 C) (Temporal)    Ht 6' (1.829 m)    Wt 207 lb (93.9 kg)    SpO2 97%    BMI 28.07 kg/m    CC: elevated prolactin level  Subjective:   HPI: Adam Berger is a 46 y.o. male presenting on 11/30/2021 for Results (Wants to discuss recent elevated prolactin results. Per pt, has h/o elevated prolactin and sxs, which have been discussed with Dr. Danise Mina. )   See prior note for details. Seen here 06/2021 with 1 month of sharp severe shooting R eye pain that can wake him up from sleep, s/p unrevealing ophthalmic evaluation. Most beneficial was benadryl and steroid, naprosyn also helped.   Saw neurology Dr Krista Blue for ongoing R eye pain - ddx includes superior oblique tendon sheath syndrome vs migraine variant - plan at that time was taper off naprosyn and trial gabapentin 358m TID PRN if recurrent pain. To consider imaging study if continued pain. Didn't try gabapentin.  He had laboratory evaluation 9Oct 14, 2022including normal CBC, CMP, TSH, ESR, CRP, ANA, RF, TPO Ab, TSI - all negative.   Overall symptoms have been better after naprosyn/benadryl course (even off medications). Occasional eye irritation but nothing like initially.   Presents today with recently elevated prolactin level incidentally found on evaluation for testosterone. Testosterone returned normal, but prolactin level was mildly elevated at 27 x2 (fasting reading).   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   Since end of 2019, noticing difficulty with anejaculation and anorgasmia - no trouble getting or maintaining erection.   Sex drive ok.  No nipple discharge.  No headaches, no double vision.  No current significant stressors.  No chest wall injury.  No known head injury.  No smoking history.   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 No repeating questions. Wife is concerned.   Mother passed away 92021/10/14after COVID. Stressful period around that time.  Maternal grandmother with alzheimer's disease dx age 389s  No fmhx early dementia or memory trouble.      Relevant past medical, surgical, family and social history reviewed and updated as indicated. Interim medical history since our last visit reviewed. Allergies and medications reviewed and updated. Outpatient Medications Prior to Visit  Medication Sig Dispense Refill   gabapentin (NEURONTIN) 300 MG capsule Take 1 capsule (300 mg total) by mouth 3 (three) times daily. 90 capsule 6   naproxen (NAPROSYN) 500 MG tablet Take one po bid x 1 week then prn pain, take with food 60 tablet 1   No facility-administered medications prior to visit.     Per HPI unless specifically indicated in ROS section below Review of Systems  Objective:  BP 120/68    Pulse 62    Temp 97.9 F (36.6 C) (Temporal)    Ht 6' (1.829 m)    Wt 207 lb (93.9 kg)    SpO2 97%    BMI 28.07 kg/m   Wt Readings from Last 3 Encounters:  11/30/21 207 lb (93.9 kg)  08/24/21 200 lb (90.7 kg)  07/14/21 202 lb (91.6 kg)      Physical Exam Vitals and nursing note reviewed.  Constitutional:      Appearance: Normal  appearance. He is not ill-appearing.  Cardiovascular:     Rate and Rhythm: Normal rate and regular rhythm.     Pulses: Normal pulses.     Heart sounds: Normal heart sounds. No murmur heard. Pulmonary:     Effort: Pulmonary effort is normal. No respiratory distress.     Breath sounds: Normal breath sounds. No wheezing, rhonchi or rales.  Chest:     Chest wall: No mass.     Comments: No gynecomastia or chest wall asymmetry Musculoskeletal:     Right lower leg: No edema.     Left lower leg: No edema.  Skin:    General: Skin is warm and dry.     Findings: No rash.  Neurological:     General: No focal deficit present.     Mental  Status: He is alert.     Cranial Nerves: Cranial nerves 2-12 are intact.     Sensory: Sensation is intact.     Motor: Motor function is intact.     Coordination: Coordination is intact. Romberg sign negative. Coordination normal.     Comments:  CN 2-12 intact FTN intact EOMI  No pronator drift   Psychiatric:        Mood and Affect: Mood normal.        Behavior: Behavior normal.      Results for orders placed or performed in visit on 08/19/21  Thyroid Stimulating Immunoglobulin  Result Value Ref Range   TSI <89 <140 % baseline  Thyroid Peroxidase Antibodies (TPO) (REFL)  Result Value Ref Range   Thyroperoxidase Ab SerPl-aCnc <1 <9 IU/mL  Rheumatoid factor  Result Value Ref Range   Rhuematoid fact SerPl-aCnc <14 <14 IU/mL  Urinalysis, Routine w reflex microscopic  Result Value Ref Range   Color, Urine YELLOW Yellow;Lt. Yellow;Straw;Dark Yellow;Amber;Green;Red;Brown   APPearance CLEAR Clear;Turbid;Slightly Cloudy;Cloudy   Specific Gravity, Urine 1.015 1.000 - 1.030   pH 6.5 5.0 - 8.0   Total Protein, Urine NEGATIVE Negative   Urine Glucose NEGATIVE Negative   Ketones, ur NEGATIVE Negative   Bilirubin Urine NEGATIVE Negative   Hgb urine dipstick NEGATIVE Negative   Urobilinogen, UA 0.2 0.0 - 1.0   Leukocytes,Ua NEGATIVE Negative   Nitrite NEGATIVE Negative   WBC, UA none seen 0-2/hpf   RBC / HPF none seen 0-2/hpf  ANA  Result Value Ref Range   Anti Nuclear Antibody (ANA) NEGATIVE NEGATIVE  TSH  Result Value Ref Range   TSH 2.57 0.35 - 5.50 uIU/mL  Comprehensive metabolic panel  Result Value Ref Range   Sodium 140 135 - 145 mEq/L   Potassium 4.5 3.5 - 5.1 mEq/L   Chloride 104 96 - 112 mEq/L   CO2 31 19 - 32 mEq/L   Glucose, Bld 81 70 - 99 mg/dL   BUN 20 6 - 23 mg/dL   Creatinine, Ser 1.28 0.40 - 1.50 mg/dL   Total Bilirubin 0.7 0.2 - 1.2 mg/dL   Alkaline Phosphatase 40 39 - 117 U/L   AST 15 0 - 37 U/L   ALT 15 0 - 53 U/L   Total Protein 6.7 6.0 - 8.3 g/dL    Albumin 4.3 3.5 - 5.2 g/dL   GFR 68.62 >60.00 mL/min   Calcium 9.6 8.4 - 10.5 mg/dL  CBC with Differential/Platelet  Result Value Ref Range   WBC 5.1 4.0 - 10.5 K/uL   RBC 4.79 4.22 - 5.81 Mil/uL   Hemoglobin 13.7 13.0 - 17.0 g/dL   HCT 41.0 39.0 -  52.0 %   MCV 85.6 78.0 - 100.0 fl   MCHC 33.3 30.0 - 36.0 g/dL   RDW 13.9 11.5 - 15.5 %   Platelets 214.0 150.0 - 400.0 K/uL   Neutrophils Relative % 62.2 43.0 - 77.0 %   Lymphocytes Relative 27.7 12.0 - 46.0 %   Monocytes Relative 9.0 3.0 - 12.0 %   Eosinophils Relative 0.5 0.0 - 5.0 %   Basophils Relative 0.6 0.0 - 3.0 %   Neutro Abs 3.1 1.4 - 7.7 K/uL   Lymphs Abs 1.4 0.7 - 4.0 K/uL   Monocytes Absolute 0.5 0.1 - 1.0 K/uL   Eosinophils Absolute 0.0 0.0 - 0.7 K/uL   Basophils Absolute 0.0 0.0 - 0.1 K/uL  C-reactive protein  Result Value Ref Range   CRP <1.0 0.5 - 20.0 mg/dL  Sedimentation rate  Result Value Ref Range   Sed Rate 5 0 - 15 mm/hr    Assessment & Plan:  This visit occurred during the SARS-CoV-2 public health emergency.  Safety protocols were in place, including screening questions prior to the visit, additional usage of staff PPE, and extensive cleaning of exam room while observing appropriate contact time as indicated for disinfecting solutions.   Problem List Items Addressed This Visit     Acute pain in right eye    Appreciate neuro eval.  He did not try gabapentin as pain resolved after short naprosyn + benadryl course.  Still unclear etiology - ?SO tendon sheath syndrome vs migraine variant.       Elevated prolactin level - Primary    Mildly elevated levels x2 (27) on testing last week.  Also endorses some recent memory deficits and anorgasmia/anejaculation.  With elevated prolactin, will check brain MRI w and w/o contrast to evaluate pituitary/hypothalamus region r/o pituitary adenoma.  Will check further labwork including 1:100 diluted prolactin to r/o hook effect.  Will be in touch with results of workup.   If all normal, ?idiopathic hyperprolactinemia.       Relevant Orders   TSH   T4, free   Insulin-like growth factor   ACTH   PROLACTIN W/DILUTION   Prolactin   MR Brain W Wo Contrast   Follicle Stimulating Hormone   Luteinizing hormone   Memory difficulty    Noted over the past year by patient and wife.  No fmhx early memory loss.  Check memory labs.  If significant worsening, could consider neurocognitive evaluation.       Relevant Orders   TSH   Vitamin B12   RPR   MR Brain W Wo Contrast   Anorgasmia of male    Anorgasmia with anejaculation ongoing for several years now, as of yet of unclear cause. Recent testosterone levels were normal but prolactin was mildly elevated - see above.       Relevant Orders   MR Brain W Wo Contrast     No orders of the defined types were placed in this encounter.  Orders Placed This Encounter  Procedures   MR Brain W Wo Contrast    Standing Status:   Future    Standing Expiration Date:   11/30/2022    Order Specific Question:   If indicated for the ordered procedure, I authorize the administration of contrast media per Radiology protocol    Answer:   Yes    Order Specific Question:   What is the patient's sedation requirement?    Answer:   No Sedation    Order Specific Question:  Does the patient have a pacemaker or implanted devices?    Answer:   No    Order Specific Question:   Preferred imaging location?    Answer:   [facility] (table limit-350lbs)   TSH   T4, free   Vitamin B12   RPR   Insulin-like growth factor   ACTH   PROLACTIN W/DILUTION   Prolactin   Follicle Stimulating Hormone   Luteinizing hormone     Patient Instructions  Labs today We will set you up for brain MRI as well.  We will be in touch with results.  CPT code for MRI brain with and without contrast:  42595 Indication - elevated prolactin level (R79.89)  Follow up plan: No follow-ups on file.  Ria Bush, MD

## 2021-11-30 NOTE — Addendum Note (Signed)
Addended by: Alvina Chou on: 11/30/2021 02:35 PM   Modules accepted: Orders

## 2021-11-30 NOTE — Assessment & Plan Note (Signed)
Appreciate neuro eval.  He did not try gabapentin as pain resolved after short naprosyn + benadryl course.  Still unclear etiology - ?SO tendon sheath syndrome vs migraine variant.

## 2021-11-30 NOTE — Assessment & Plan Note (Signed)
Noted over the past year by patient and wife.  No fmhx early memory loss.  Check memory labs.  If significant worsening, could consider neurocognitive evaluation.

## 2021-11-30 NOTE — Assessment & Plan Note (Signed)
Anorgasmia with anejaculation ongoing for several years now, as of yet of unclear cause. Recent testosterone levels were normal but prolactin was mildly elevated - see above.

## 2021-11-30 NOTE — Telephone Encounter (Signed)
Discussed at OV

## 2021-12-03 ENCOUNTER — Encounter: Payer: Self-pay | Admitting: Family Medicine

## 2021-12-04 ENCOUNTER — Encounter: Payer: Self-pay | Admitting: Family Medicine

## 2021-12-04 LAB — PROLACTIN W/DILUTION: PROLACTIN,UNDILUTED: 5.7 ng/mL (ref 2.0–18.0)

## 2021-12-04 LAB — INSULIN-LIKE GROWTH FACTOR
IGF-I, LC/MS: 161 ng/mL (ref 52–328)
Z-Score (Male): 0.3 SD (ref ?–2.0)

## 2021-12-04 LAB — ACTH: C206 ACTH: 21 pg/mL (ref 6–50)

## 2021-12-04 LAB — RPR: RPR Ser Ql: NONREACTIVE

## 2021-12-04 NOTE — Telephone Encounter (Signed)
Replied via result message.

## 2021-12-11 ENCOUNTER — Encounter: Payer: Self-pay | Admitting: *Deleted

## 2021-12-11 NOTE — Telephone Encounter (Signed)
Working on Designer, industrial/product.   See TE 12/11/21

## 2021-12-15 NOTE — Addendum Note (Signed)
Addended by: Maisie Fus on: 12/15/2021 02:50 PM   Modules accepted: Orders

## 2022-01-01 ENCOUNTER — Other Ambulatory Visit: Payer: Self-pay

## 2022-01-01 ENCOUNTER — Ambulatory Visit
Admission: RE | Admit: 2022-01-01 | Discharge: 2022-01-01 | Disposition: A | Discharge status: Home / Self Care | Payer: 59 | Source: Ambulatory Visit | Attending: Family Medicine | Admitting: Family Medicine

## 2022-01-01 DIAGNOSIS — F5232 Male orgasmic disorder: Secondary | ICD-10-CM

## 2022-01-01 DIAGNOSIS — R413 Other amnesia: Secondary | ICD-10-CM

## 2022-01-01 DIAGNOSIS — R7989 Other specified abnormal findings of blood chemistry: Secondary | ICD-10-CM

## 2022-01-01 MED ORDER — GADOBENATE DIMEGLUMINE 529 MG/ML IV SOLN
10.0000 mL | Freq: Once | INTRAVENOUS | Status: AC | PRN
  Administered 2022-01-01: 10 mL via INTRAVENOUS

## 2022-01-02 ENCOUNTER — Encounter: Payer: Self-pay | Admitting: Family Medicine

## 2022-01-04 NOTE — Telephone Encounter (Signed)
[facility] Night - Client TELEPHONE ADVICE RECORD Access[facility]urse Patient Name: Adam Berger [facility] Gender: Male DOB: [birth year] Age: 46 Y 8 M 21 D Return Phone Number: [phone] (Primary), [phone] (Secondary) Address: City/ State/ Zip: [place]  [address] Client [facility] Night - Client Client Site [facility] - Night Provider Eustaquio Boyden - MD Contact Type Call Who Is Calling Patient / Member / Family / Caregiver Call Type Triage / Clinical Caller Name Gerome Kokesh Relationship To Patient Spouse Return Phone Number [phone] (Primary) Chief Complaint Abdominal Pain Reason for Call Symptomatic / Request for Health Information Initial Comment Caller states that her husband had an MRI done yesterday, today he is having waves of nausea and abdominal pain. Current temp is 99.6 Translation No Nurse Assessment Nurse: White, RN, Graylon Gunning Date/Time (Eastern Time): 01/02/2022 9:17:40 PM Confirm and document reason for call. If symptomatic, describe symptoms. ---Caller states that her husband had an MRI Brain with contrast done yesterday d/t elevated Prolactin levels and memory loss, today he is having waves of nausea and severe abdominal pain. Current temp is 99.6. Tingling all over. Sharp abdominal pain that shoots downward. Headache, but no neck pain. Does the patient have any new or worsening symptoms? ---Yes Will a triage be completed? ---Yes Related visit to physician within the last 2 weeks? ---Yes Does the PT have any chronic conditions? (i.e. diabetes, asthma, this includes High risk factors for pregnancy, etc.) ---Unknown Is this a behavioral health or substance abuse call? ---No Guidelines Guideline Title Affirmed Question Affirmed Notes Nurse Date/Time Lamount Cohen Time) Post-Op Symptoms and Questions [1] Widespread rash AND [2] bright red, sunburn-like White,  RN, [facility] 01/02/2022 9:20:44 PM Disp. Time Lamount Cohen Time) Disposition Final User 01/02/2022 9:24:10 PM Go to ED Now Yes Cliffton Asters, RN, Graylon Gunning PLEASE NOTE: All timestamps contained within this report are represented as [place] Standard Time. CONFIDENTIALTY NOTICE: This fax transmission is intended only for the addressee. It contains information that is legally privileged, confidential or otherwise protected from use or disclosure. If you are not the intended recipient, you are strictly prohibited from reviewing, disclosing, copying using or disseminating any of this information or taking any action in reliance on or regarding this information. If you have received this fax in error, please notify us immediately by telephone so that we can arrange for its return to [place]. Phone: [phone], Toll-Free: [phone], Fax: [phone] Page: 2 of 2 Call Id: [id number] Caller Disagree/Comply Comply Caller Understands Yes PreDisposition Did not know what to do Care Advice Given Per Guideline GO TO ED NOW: * You need to be seen in the Emergency Department. * Go to the ED at ___________ Hospital. * Leave now. Drive carefully. CARE ADVICE per Post-Op Symptoms and Questions (Adult) guideline. Referrals [facility] - E

## 2022-01-04 NOTE — Telephone Encounter (Signed)
I spoke with pt's wife; pt did not go to ED over weekend. Pt has turned the corner; abd pain has subsided. Pt is having watery diarrhea.  No dry mouth and no dizziness.Pt is not eating today and pt is drinking protein drinks.no vomiting. Pts wife wants to wait on appt; and will cb to schedule appt if needed. UC & ED precautions given and pts wife voiced understanding. Sending note to DR G who is out of office and Dr Damita Dunnings who is in office and Lattie Haw CMA.

## 2022-01-05 NOTE — Telephone Encounter (Signed)
Noted. Thanks.

## 2022-05-09 ENCOUNTER — Encounter: Payer: Self-pay | Admitting: Emergency Medicine

## 2022-05-09 ENCOUNTER — Telehealth: Payer: 59 | Admitting: Emergency Medicine

## 2022-05-09 DIAGNOSIS — B9689 Other specified bacterial agents as the cause of diseases classified elsewhere: Secondary | ICD-10-CM

## 2022-05-09 DIAGNOSIS — J329 Chronic sinusitis, unspecified: Secondary | ICD-10-CM | POA: Diagnosis not present

## 2022-05-09 MED ORDER — DOXYCYCLINE HYCLATE 100 MG PO TABS: 100.0000 mg | ORAL_TABLET | Freq: Two times a day (BID) | ORAL | 0 refills | Status: AC

## 2022-05-09 NOTE — Progress Notes (Signed)
I have spent 5 minutes in review of e-visit questionnaire, review and updating patient chart, medical decision making and response to patient.   Shakyla Nolley, PA-C    

## 2022-05-09 NOTE — Progress Notes (Signed)

## 2022-07-11 IMAGING — MR MR HEAD WO/W CM
14 of 21 series · 35 of 48 positions shown · IV contrast (10 ml multihance)
Comparison: None.

CLINICAL DATA: Memory loss and elevated prolactin

EXAM:
MRI HEAD WITHOUT AND WITH CONTRAST
TECHNIQUE: Multiplanar, multiecho pulse sequences of the brain and surrounding
structures were obtained without and with intravenous contrast.
CONTRAST:  10mL MULTIHANCE GADOBENATE DIMEGLUMINE 529 MG/ML IV SOLN

[Series 2: T1 · sagittal · 5.0mm · 0.45mm/px · 1 of 21 slices shown (1 of 3)]
[im 1/21]
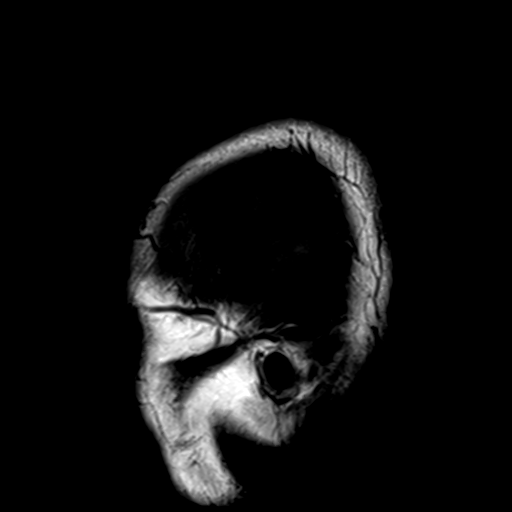

[Series 3: DWI · axial · 3.0mm · 1.80mm/px · z∈[-43,+103]mm · 9 of 100 slices shown]
[im 1/100]
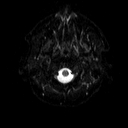
[im 13/100]
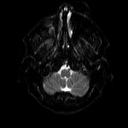
[im 25/100]
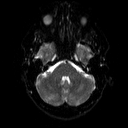
[im 38/100]
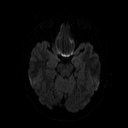
[im 50/100]
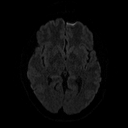
[im 62/100]
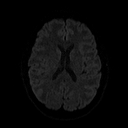
[im 75/100]
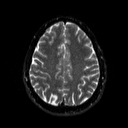
[im 87/100]
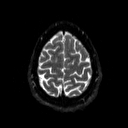
[im 100/100]
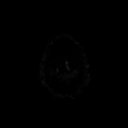

[Series 4: dwi_adc · axial · 3.0mm · 1.80mm/px · z∈[-43,+103]mm · 4 of 46 slices shown]
[im 1/46]
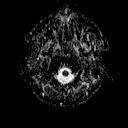
[im 16/46]
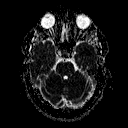
[im 31/46]
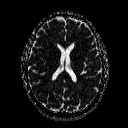
[im 46/46]
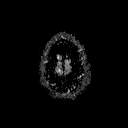

[Series 5: T2 · axial · 5.0mm · 0.36mm/px · z∈[-50,+111]mm · 2 of 26 slices shown (1 of 2)]
[im 1/26]
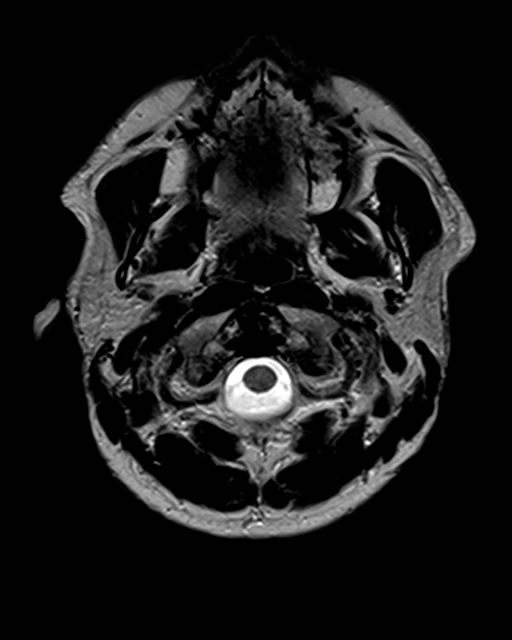
[im 26/26]
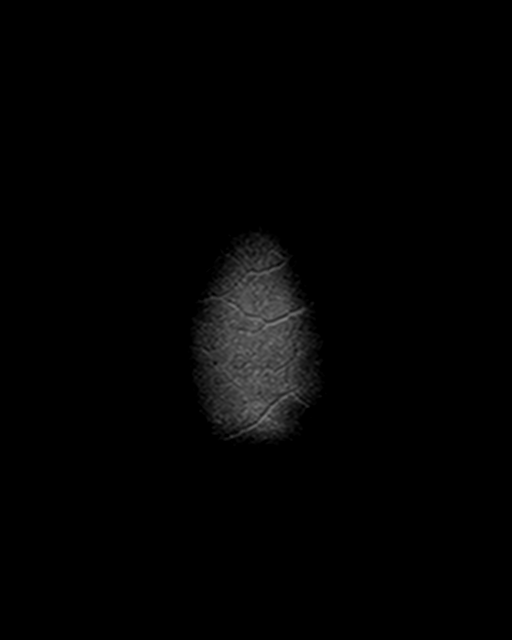

[Series 6: FLAIR · axial · 3.0mm · 0.45mm/px · z∈[-44,+113]mm · 3 of 35 slices shown]
[im 1/35]
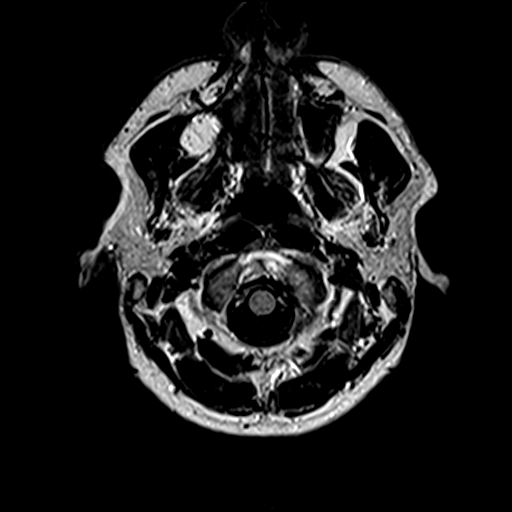
[im 18/35]
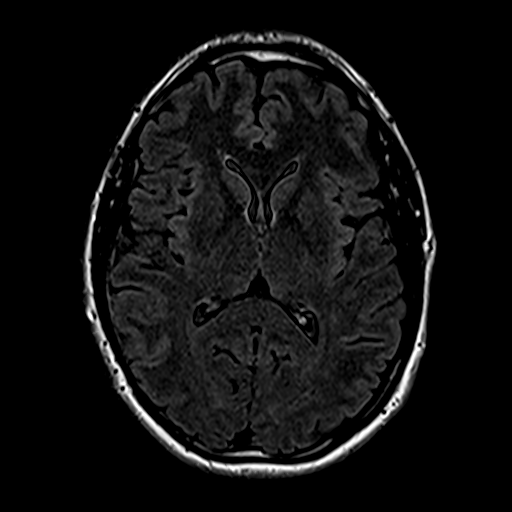
[im 35/35]
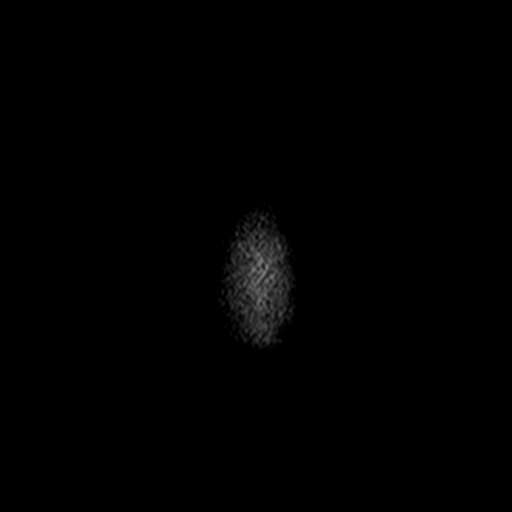

[Series 7: mip_images(sw) · axial · 32.0mm · 0.94mm/px · z∈[-34,+93]mm · 3 of 33 slices shown]
[im 1/33]
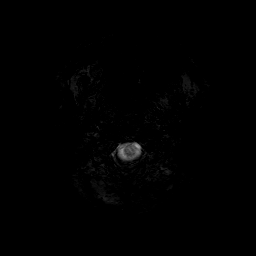
[im 17/33]
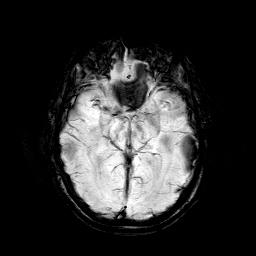
[im 33/33]
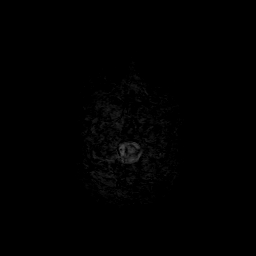

[Series 8: swi_images · axial · 4.0mm · 0.94mm/px · z∈[-48,+107]mm · 4 of 40 slices shown]
[im 1/40]
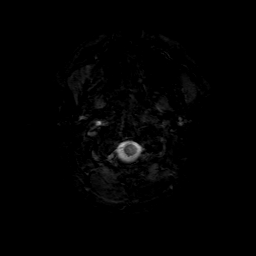
[im 14/40]
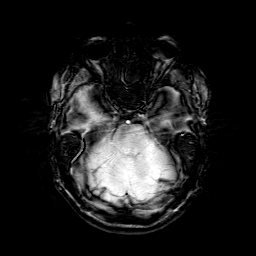
[im 27/40]
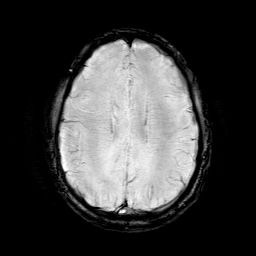
[im 40/40]
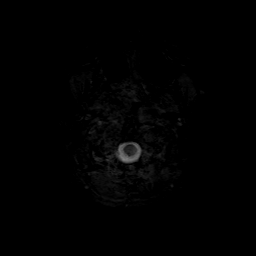

[Series 9: T1 · sagittal · 3.0mm · 0.33mm/px · 1 of 11 slices shown (2 of 3)]
[im 1/11]
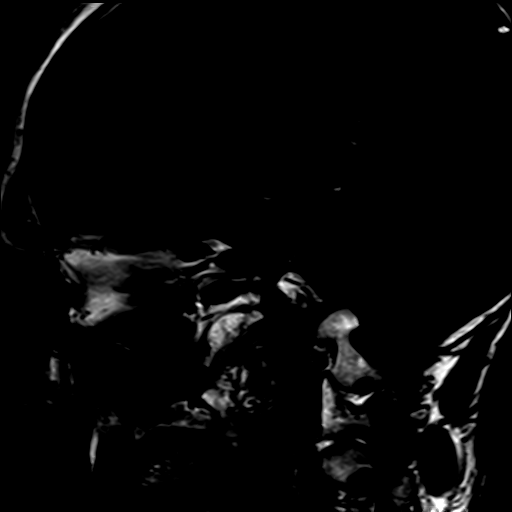

[Series 10: T1 · coronal · 3.0mm · 0.33mm/px · 1 of 11 slices shown (3 of 3)]
[im 1/11]
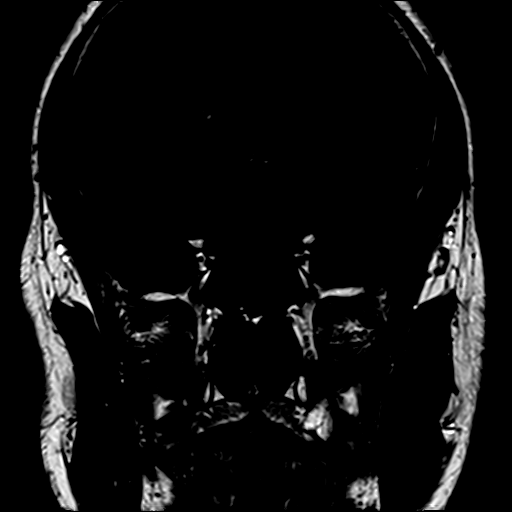

[Series 11: T2 · coronal · 3.0mm · 0.23mm/px · 1 of 11 slices shown (2 of 2)]
[im 1/11]
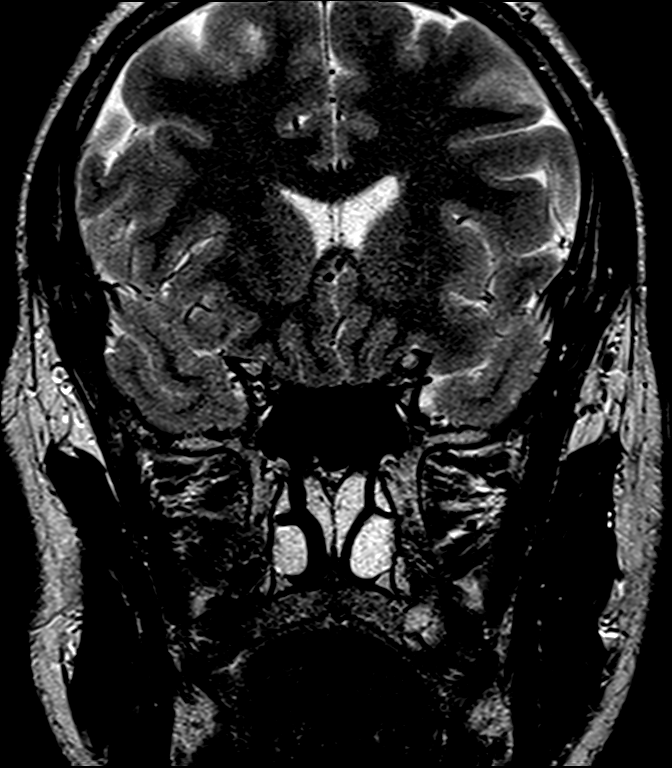

[Series 12: pre cor dynamic · coronal · non-contrast · 3.0mm · 0.35mm/px · 1 of 7 slices shown]
[im 1/7]
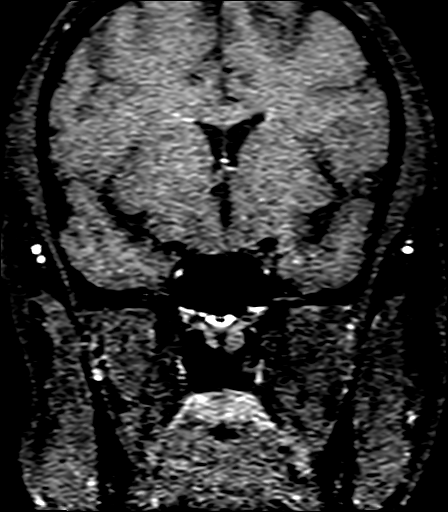

[Series 19: T1 post-contrast · sagittal · 3.0mm · 0.33mm/px · 1 of 11 slices shown (1 of 3)]
[im 1/11]
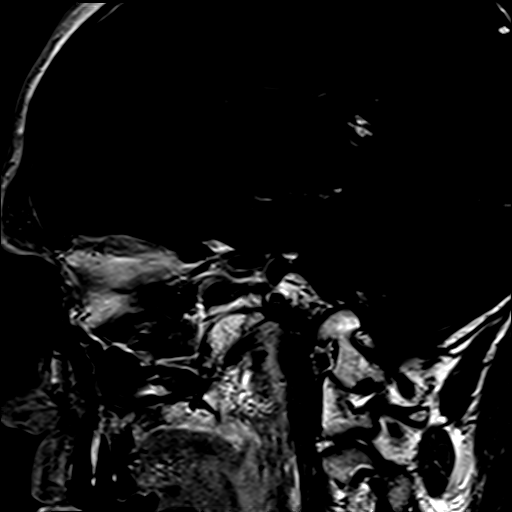

[Series 20: T1 post-contrast · coronal · 3.0mm · 0.33mm/px · 1 of 11 slices shown (2 of 3)]
[im 1/11]
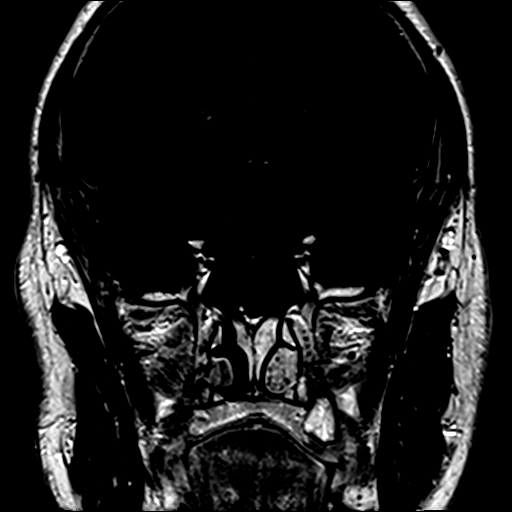

[Series 22: T1 post-contrast · coronal · 5.0mm · 0.45mm/px · 3 of 30 slices shown (3 of 3)]
[im 1/30]
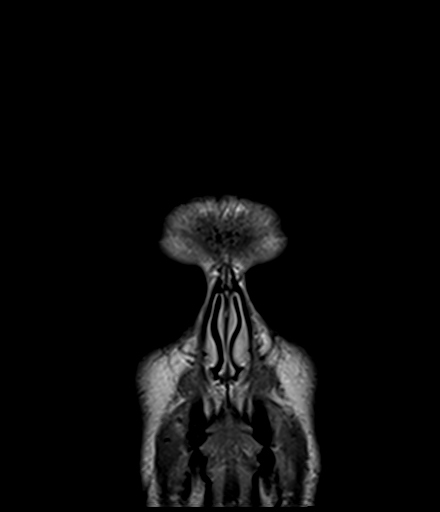
[im 15/30]
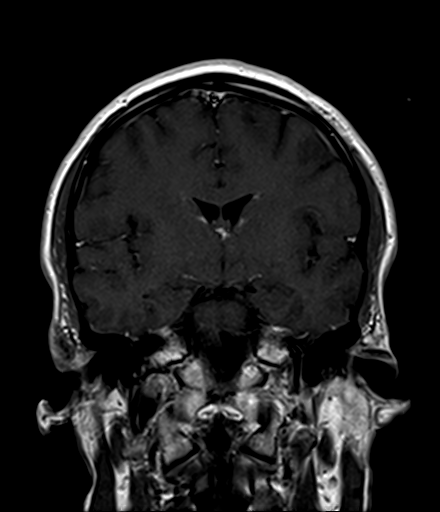
[im 30/30]
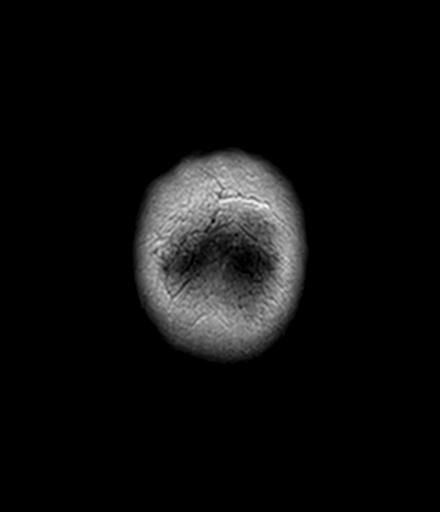

[35 of 48 positions shown; findings below may reference images not displayed]

FINDINGS: Brain: Normal sized pituitary gland with homogeneous enhancement.
Unremarkable infundibulum and chiasm. Normal brain signal and
volume. No hydrocephalus or collection

Vascular: Normal flow voids and vascular enhancements

Skull and upper cervical spine: Normal marrow signal

Sinuses/Orbits: Right maxillary sinus atelectasis. No associated on
thalamus.
IMPRESSION: 1. Normal MRI of the brain and pituitary gland.
2. Atelectatic right maxillary sinus.

## 2023-08-02 DIAGNOSIS — L578 Other skin changes due to chronic exposure to nonionizing radiation: Secondary | ICD-10-CM | POA: Diagnosis not present

## 2023-08-02 DIAGNOSIS — Z85828 Personal history of other malignant neoplasm of skin: Secondary | ICD-10-CM | POA: Diagnosis not present

## 2023-11-09 ENCOUNTER — Encounter: Payer: Self-pay | Admitting: Family Medicine

## 2023-11-09 ENCOUNTER — Ambulatory Visit (INDEPENDENT_AMBULATORY_CARE_PROVIDER_SITE_OTHER): Payer: 59 | Admitting: Family Medicine

## 2023-11-09 VITALS — BP 120/82 | HR 61 | Temp 98.4°F | Ht 71.0 in | Wt 204.2 lb

## 2023-11-09 DIAGNOSIS — Z1211 Encounter for screening for malignant neoplasm of colon: Secondary | ICD-10-CM | POA: Diagnosis not present

## 2023-11-09 DIAGNOSIS — Z13220 Encounter for screening for lipoid disorders: Secondary | ICD-10-CM | POA: Diagnosis not present

## 2023-11-09 DIAGNOSIS — M2761 Osseointegration failure of dental implant: Secondary | ICD-10-CM | POA: Insufficient documentation

## 2023-11-09 DIAGNOSIS — Z136 Encounter for screening for cardiovascular disorders: Secondary | ICD-10-CM | POA: Diagnosis not present

## 2023-11-09 DIAGNOSIS — Z0000 Encounter for general adult medical examination without abnormal findings: Secondary | ICD-10-CM | POA: Diagnosis not present

## 2023-11-09 NOTE — Progress Notes (Signed)
Ph: [phone] Fax: [phone]   Patient ID: Adam Berger, male    DOB: 06/19/1976, 47 y.o.   MRN: [id number]  This visit was conducted in person.  BP 120/82   Pulse 61   Temp 98.4 F (36.9 C) (Oral)   Ht 5\' 11"  (1.803 m)   Wt 204 lb 4 oz (92.6 kg)   SpO2 95%   BMI 28.49 kg/m    CC: CPE Subjective:   HPI: Adam Berger is a 47 y.o. male presenting on 11/09/2023 for Annual Exam   Continues caring for father after mother passed away Dec 12, 2020 from COVID. He lives a mile away.   Recent dental implant Dec 12, 2022. It loosened - oral surgeon (Dr Retta Mac) was concerned about this, found bone loss. Rec vit D levels checked. Just started Zpack through their office.  No results found for: "25OHVITD2", "25OHVITD3", "VD25OH"   Preventative: Colon cancer screening - discussed options, requests iFOB Prostate screening - no fmhx Flu shot declined COVID - received first 2, no booster Tdap 2017/12/12 Seat belt use discussed Sunscreen use discussed. No changing moles on skin - saw dermatologist, h/o BCC removed from R nose ~2019 Sleep - averaging 7-8 hours/night  Non smoker Alcohol - none Dentist - q6 mo Eye exam - has not seen recently   Lives with wife and 4 children Occupation: pastor Edu: [employer] Activity: works out regularly (5:30am every morning), some cardio Diet: good water, fruits/vegetables daily, fish      Relevant past medical, surgical, family and social history reviewed and updated as indicated. Interim medical history since our last visit reviewed. Allergies and medications reviewed and updated. No outpatient medications prior to visit.   No facility-administered medications prior to visit.     Per HPI unless specifically indicated in ROS section below Review of Systems  Constitutional:  Negative for activity change, appetite change, chills, fatigue, fever and unexpected weight change.  HENT:  Negative for hearing loss.   Eyes:  Negative for visual disturbance.   Respiratory:  Negative for cough, chest tightness, shortness of breath and wheezing.   Cardiovascular:  Negative for chest pain, palpitations and leg swelling.  Gastrointestinal:  Negative for abdominal distention, abdominal pain, blood in stool, constipation, diarrhea, nausea and vomiting.  Genitourinary:  Negative for difficulty urinating and hematuria.  Musculoskeletal:  Negative for arthralgias, myalgias and neck pain.  Skin:  Negative for rash.  Neurological:  Negative for dizziness, seizures, syncope and headaches.  Hematological:  Negative for adenopathy. Does not bruise/bleed easily.  Psychiatric/Behavioral:  Negative for dysphoric mood. The patient is not nervous/anxious.     Objective:  BP 120/82   Pulse 61   Temp 98.4 F (36.9 C) (Oral)   Ht 5\' 11"  (1.803 m)   Wt 204 lb 4 oz (92.6 kg)   SpO2 95%   BMI 28.49 kg/m   Wt Readings from Last 3 Encounters:  11/09/23 204 lb 4 oz (92.6 kg)  11/30/21 207 lb (93.9 kg)  08/24/21 200 lb (90.7 kg)      Physical Exam Vitals and nursing note reviewed.  Constitutional:      General: He is not in acute distress.    Appearance: Normal appearance. He is well-developed. He is not ill-appearing.  HENT:     Head: Normocephalic and atraumatic.     Right Ear: Hearing, tympanic membrane, ear canal and external ear normal.     Left Ear: Hearing, tympanic membrane, ear canal and external ear normal.     Mouth/Throat:  Mouth: Mucous membranes are moist.     Pharynx: Oropharynx is clear. No oropharyngeal exudate or posterior oropharyngeal erythema.     Comments: Recently pulled L upper dental implant with some blood present Eyes:     General: No scleral icterus.    Extraocular Movements: Extraocular movements intact.     Conjunctiva/sclera: Conjunctivae normal.     Pupils: Pupils are equal, round, and reactive to light.  Neck:     Thyroid: No thyroid mass or thyromegaly.  Cardiovascular:     Rate and Rhythm: Normal rate and regular  rhythm.     Pulses: Normal pulses.          Radial pulses are 2+ on the right side and 2+ on the left side.     Heart sounds: Normal heart sounds. No murmur heard. Pulmonary:     Effort: Pulmonary effort is normal. No respiratory distress.     Breath sounds: Normal breath sounds. No wheezing, rhonchi or rales.  Abdominal:     General: Bowel sounds are normal. There is no distension.     Palpations: Abdomen is soft. There is no mass.     Tenderness: There is no abdominal tenderness. There is no guarding or rebound.     Hernia: No hernia is present.  Musculoskeletal:        General: Normal range of motion.     Cervical back: Normal range of motion and neck supple.     Right lower leg: No edema.     Left lower leg: No edema.  Lymphadenopathy:     Cervical: No cervical adenopathy.  Skin:    General: Skin is warm and dry.     Findings: No rash.  Neurological:     General: No focal deficit present.     Mental Status: He is alert and oriented to person, place, and time.  Psychiatric:        Mood and Affect: Mood normal.        Behavior: Behavior normal.        Thought Content: Thought content normal.        Judgment: Judgment normal.       Results for orders placed or performed in visit on 11/30/21  CBC and differential   Collection Time: 11/24/21 12:00 AM  Result Value Ref Range   HCT 42 41 - 53  PSA   Collection Time: 11/24/21 12:00 AM  Result Value Ref Range   PSA 0.9   Testosterone   Collection Time: 11/24/21 12:00 AM  Result Value Ref Range   Testosterone 611     Assessment & Plan:   Problem List Items Addressed This Visit     Healthcare maintenance - Primary (Chronic)   Preventative protocols reviewed and updated unless pt declined. Discussed healthy diet and lifestyle.       Dental implant osseointegration failure   Check labwork for further evaluation of this per pt /oral surgeon request      Relevant Orders   Comprehensive metabolic panel   TSH    VITAMIN D 25 Hydroxy (Vit-D Deficiency, Fractures)   Other Visit Diagnoses       Encounter for lipid screening for cardiovascular disease       Relevant Orders   Lipid panel     Special screening for malignant neoplasms, colon       Relevant Orders   Fecal occult blood, imunochemical        No orders of the defined types were placed in  this encounter.   Orders Placed This Encounter  Procedures   Fecal occult blood, imunochemical    Standing Status:   Future    Expiration Date:   11/08/2024   Lipid panel   Comprehensive metabolic panel   TSH   VITAMIN D 25 Hydroxy (Vit-D Deficiency, Fractures)    Patient Instructions  Labs today  Pick up stool kit at lab.  Work on calcium in diet. Good to see you today  Follow up plan: Return in about 2 years (around 11/08/2025) for annual exam, prior fasting for blood work.  Eustaquio Boyden, MD

## 2023-11-09 NOTE — Patient Instructions (Addendum)
Labs today  Pick up stool kit at lab.  Work on calcium in diet. Good to see you today

## 2023-11-09 NOTE — Assessment & Plan Note (Addendum)
Check labwork for further evaluation of this per pt /oral surgeon request

## 2023-11-09 NOTE — Assessment & Plan Note (Signed)
Preventative protocols reviewed and updated unless pt declined. Discussed healthy diet and lifestyle.  

## 2023-11-10 LAB — LIPID PANEL
Cholesterol: 231 mg/dL — ABNORMAL HIGH (ref 0–200)
HDL: 52.8 mg/dL (ref >39.00)
LDL Cholesterol: 161 mg/dL — ABNORMAL HIGH (ref 0–99)
NonHDL: 178.05
Total CHOL/HDL Ratio: 4
Triglycerides: 84 mg/dL (ref 0.0–149.0)
VLDL: 16.8 mg/dL (ref 0.0–40.0)

## 2023-11-10 LAB — COMPREHENSIVE METABOLIC PANEL
ALT: 16 U/L (ref 0–53)
AST: 16 U/L (ref 0–37)
Albumin: 4.6 g/dL (ref 3.5–5.2)
Alkaline Phosphatase: 59 U/L (ref 39–117)
BUN: 19 mg/dL (ref 6–23)
CO2: 30 meq/L (ref 19–32)
Calcium: 9.6 mg/dL (ref 8.4–10.5)
Chloride: 102 meq/L (ref 96–112)
Creatinine, Ser: 1.18 mg/dL (ref 0.40–1.50)
GFR: 74.49 mL/min (ref >60.00)
Glucose, Bld: 84 mg/dL (ref 70–99)
Potassium: 4.8 meq/L (ref 3.5–5.1)
Sodium: 139 meq/L (ref 135–145)
Total Bilirubin: 0.7 mg/dL (ref 0.2–1.2)
Total Protein: 7.4 g/dL (ref 6.0–8.3)

## 2023-11-10 LAB — VITAMIN D 25 HYDROXY (VIT D DEFICIENCY, FRACTURES): VITD: 32.76 ng/mL (ref 30.00–100.00)

## 2023-11-10 LAB — TSH: TSH: 1.95 u[IU]/mL (ref 0.35–5.50)

## 2023-11-11 ENCOUNTER — Other Ambulatory Visit: Payer: Self-pay | Admitting: Radiology

## 2023-11-11 DIAGNOSIS — Z1211 Encounter for screening for malignant neoplasm of colon: Secondary | ICD-10-CM

## 2023-11-14 LAB — FECAL OCCULT BLOOD, IMMUNOCHEMICAL: Fecal Occult Bld: NEGATIVE

## 2023-11-17 ENCOUNTER — Encounter: Payer: Self-pay | Admitting: Family Medicine

## 2023-11-17 ENCOUNTER — Other Ambulatory Visit: Payer: Self-pay | Admitting: Family Medicine

## 2023-11-17 MED ORDER — VITAMIN D3 25 MCG (1000 UT) PO CAPS: 1.0000 | ORAL_CAPSULE | Freq: Every day | ORAL | Status: AC

## 2023-12-06 ENCOUNTER — Ambulatory Visit: Payer: 59 | Admitting: Family Medicine

## 2023-12-15 ENCOUNTER — Telehealth: Payer: Self-pay | Admitting: Family Medicine

## 2023-12-15 ENCOUNTER — Ambulatory Visit: Payer: Self-pay | Admitting: Family Medicine

## 2023-12-15 ENCOUNTER — Telehealth: Payer: 59

## 2023-12-15 MED ORDER — OSELTAMIVIR PHOSPHATE 75 MG PO CAPS: 75.0000 mg | ORAL_CAPSULE | Freq: Two times a day (BID) | ORAL | 0 refills | Status: AC

## 2023-12-15 NOTE — Telephone Encounter (Signed)
Copied From CRM [id number]. Reason for Triage: Patient spouse called requesting [place] flu - patient son has the flu and patient is having aches/pains, fever, and congestion   Chief Complaint: cough Symptoms: Body aches, pain, cough,nasal & chest congestion, fever Frequency: yesterday Pertinent Negatives: Patient denies SOB, wheezing Disposition: [] ED /[] Urgent Care (no appt availability in office) / [x] Appointment(In office/virtual)/ []  [facility]/ [] Home Care/ [] Refused Recommended Disposition /[] [facility]/ []  Follow-up with PCP Additional Notes: son tested positive for flu    Reason for Disposition  [1] Known COPD or other severe lung disease (i.e., bronchiectasis, cystic fibrosis, lung surgery) AND [2] worsening symptoms (i.e., increased sputum purulence or amount, increased breathing difficulty    Pt son tested positive for flu- pt thinks he has flu and would like tamiflu  Answer Assessment - Initial Assessment Questions 1. ONSET: "When did the cough begin?"      yesterday 2. SEVERITY: "How bad is the cough today?"      N/a 3. SPUTUM: "Describe the color of your sputum" (none, dry cough; clear, white, yellow, green)     none 4. HEMOPTYSIS: "Are you coughing up any blood?" If so ask: "How much?" (flecks, streaks, tablespoons, etc.)     N/a 5. DIFFICULTY BREATHING: "Are you having difficulty breathing?" If Yes, ask: "How bad is it?" (e.g., mild, moderate, severe)    - MILD: No SOB at rest, mild SOB with walking, speaks normally in sentences, can lie down, no retractions, pulse < 100.    - MODERATE: SOB at rest, SOB with minimal exertion and prefers to sit, cannot lie down flat, speaks in phrases, mild retractions, audible wheezing, pulse 100-120.    - SEVERE: Very SOB at rest, speaks in single words, struggling to breathe, sitting hunched forward, retractions, pulse > 120      none 6. FEVER: "Do you have a fever?" If Yes, ask: "What is your temperature, how was  it measured, and when did it start?"     99 with tylenol 7. CARDIAC HISTORY: "Do you have any history of heart disease?" (e.g., heart attack, congestive heart failure)      no 8. LUNG HISTORY: "Do you have any history of lung disease?"  (e.g., pulmonary embolus, asthma, emphysema)     no 9. PE RISK FACTORS: "Do you have a history of blood clots?" (or: recent major surgery, recent prolonged travel, bedridden)     no 10. OTHER SYMPTOMS: "Do you have any other symptoms?" (e.g., runny nose, wheezing, chest pain)       Body aches, pain, cough,nasal & chest congestion, fever 11. PREGNANCY: "Is there any chance you are pregnant?" "When was your last menstrual period?"       N/a 12. TRAVEL: "Have you traveled out of the country in the last month?" (e.g., travel history, exposures)       N/a  Protocols used: Cough - Acute Productive-A-AH

## 2023-12-15 NOTE — Telephone Encounter (Signed)
CRM today was read only - I could not document in it. Please notify I've sent tamiflu for pt to take.  Watch for HA, nausea possible side effects.  Let us know if worsening symptoms.

## 2023-12-15 NOTE — Telephone Encounter (Signed)
Called patient let know script sent in. If any increased symptoms or side effects reviewed he will reach out.

## 2023-12-15 NOTE — Telephone Encounter (Signed)
See subsequent phone note.
